# Patient Record
Sex: Male | Born: 1965 | Race: Black or African American | Hispanic: No | Marital: Single | Smoking: Current every day smoker
Health system: Southern US, Community
[De-identification: ages and names within clinical notes are randomized; demographics above are authoritative.]

## PROBLEM LIST (undated history)

## (undated) DIAGNOSIS — I10 Essential (primary) hypertension: Secondary | ICD-10-CM

## (undated) HISTORY — PX: OTHER SURGICAL HISTORY: SHX169

## (undated) HISTORY — PX: ABDOMINAL SURGERY: SHX537

---

## 2012-10-29 ENCOUNTER — Emergency Department (HOSPITAL_COMMUNITY)
Admission: EM | Admit: 2012-10-29 | Discharge: 2012-10-30 | Disposition: A | Discharge status: Home / Self Care | Payer: Medicaid Other | Attending: Emergency Medicine | Admitting: Emergency Medicine

## 2012-10-29 ENCOUNTER — Encounter (HOSPITAL_COMMUNITY): Payer: Self-pay

## 2012-10-29 DIAGNOSIS — R509 Fever, unspecified: Secondary | ICD-10-CM | POA: Insufficient documentation

## 2012-10-29 DIAGNOSIS — I10 Essential (primary) hypertension: Secondary | ICD-10-CM | POA: Insufficient documentation

## 2012-10-29 DIAGNOSIS — K5900 Constipation, unspecified: Secondary | ICD-10-CM | POA: Insufficient documentation

## 2012-10-29 DIAGNOSIS — F17200 Nicotine dependence, unspecified, uncomplicated: Secondary | ICD-10-CM | POA: Insufficient documentation

## 2012-10-29 DIAGNOSIS — R609 Edema, unspecified: Secondary | ICD-10-CM | POA: Insufficient documentation

## 2012-10-29 DIAGNOSIS — Z79899 Other long term (current) drug therapy: Secondary | ICD-10-CM | POA: Insufficient documentation

## 2012-10-29 HISTORY — DX: Essential (primary) hypertension: I10

## 2012-10-29 LAB — CBC WITH DIFFERENTIAL/PLATELET
Eosinophils Absolute: 0.2 10*3/uL (ref 0.0–0.7)
Hemoglobin: 15.4 g/dL (ref 13.0–17.0)
Lymphocytes Relative: 30 % (ref 12–46)
Lymphs Abs: 2.5 10*3/uL (ref 0.7–4.0)
MCH: 27.1 pg (ref 26.0–34.0)
Monocytes Relative: 8 % (ref 3–12)
Neutrophils Relative %: 59 % (ref 43–77)
Platelets: 284 10*3/uL (ref 150–400)
RBC: 5.68 MIL/uL (ref 4.22–5.81)
WBC: 8.3 10*3/uL (ref 4.0–10.5)

## 2012-10-29 LAB — POCT I-STAT, CHEM 8
BUN: 6 mg/dL (ref 6–23)
Chloride: 101 mEq/L (ref 96–112)
Creatinine, Ser: 0.9 mg/dL (ref 0.50–1.35)
Potassium: 3.6 mEq/L (ref 3.5–5.1)
Sodium: 138 mEq/L (ref 135–145)

## 2012-10-29 NOTE — ED Notes (Signed)
Pt complains of right sided back oain that radiates around to his abdomen for two days, no urination problems, no vomiting

## 2012-10-30 ENCOUNTER — Emergency Department (HOSPITAL_COMMUNITY): Payer: Medicaid Other

## 2012-10-30 LAB — URINALYSIS, ROUTINE W REFLEX MICROSCOPIC
Glucose, UA: NEGATIVE mg/dL
Hgb urine dipstick: NEGATIVE
Leukocytes, UA: NEGATIVE
Specific Gravity, Urine: 1.027 (ref 1.005–1.030)
pH: 6 (ref 5.0–8.0)

## 2012-10-30 LAB — URINE MICROSCOPIC-ADD ON

## 2012-10-30 MED ORDER — POLYETHYLENE GLYCOL 3350 17 G PO PACK: 17.0000 g | PACK | Freq: Every day | ORAL | Status: DC

## 2012-10-30 MED ORDER — DIPHENHYDRAMINE HCL 50 MG/ML IJ SOLN
25.0000 mg | Freq: Once | INTRAMUSCULAR | Status: DC
  Filled 2012-10-30: qty 1

## 2012-10-30 MED ORDER — KETOROLAC TROMETHAMINE 30 MG/ML IJ SOLN
30.0000 mg | Freq: Once | INTRAMUSCULAR | Status: AC
  Administered 2012-10-30: 30 mg via INTRAVENOUS
  Filled 2012-10-30: qty 1

## 2012-10-30 MED ORDER — SODIUM CHLORIDE 0.9 % IV BOLUS (SEPSIS)
1000.0000 mL | Freq: Once | INTRAVENOUS | Status: AC
  Administered 2012-10-30: 1000 mL via INTRAVENOUS

## 2012-10-30 NOTE — ED Provider Notes (Signed)
CSN: [id number]     Arrival date & time 10/29/12  2221 History     First MD Initiated Contact with Patient 10/30/12 [phone]     Chief Complaint  Patient presents with  . Abdominal Pain   (Consider location/radiation/quality/duration/timing/severity/associated sxs/prior Treatment) The history is provided by the patient. No language interpreter was used.  DUELL HOLDREN is a 47 y/o M with PMHx of HTN presenting to the ED with right-sided flank pain that has been ongoing for the past 2 days. Patient reported that the pain is a shooting discomfort with radiation to the abdomen. Stated that motion makes the pain worse. Reported that he felt feverish. Patient denied history of kidney stones. Denied nausea, vomiting, chills, chest pain, shortness of breath, difficulty breathing, dysuria, hematuria. PCP none   Past Medical History  Diagnosis Date  . Hypertension    History reviewed. No pertinent past surgical history. History reviewed. No pertinent family history. History  Substance Use Topics  . Smoking status: Current Every Day Smoker  . Smokeless tobacco: Not on file  . Alcohol Use: No    Review of Systems  Constitutional: Positive for fever.  Eyes: Negative for visual disturbance.  Respiratory: Negative for chest tightness and shortness of breath.   Gastrointestinal: Negative for nausea, vomiting and diarrhea.  Genitourinary: Positive for flank pain. Negative for dysuria, hematuria, decreased urine volume and difficulty urinating.  Neurological: Negative for weakness.  All other systems reviewed and are negative.    Allergies  Review of patient's allergies indicates no known allergies.  Home Medications   Current Outpatient Rx  Name  Route  Sig  Dispense  Refill  . amoxicillin (AMOXIL) 500 MG capsule   Oral   Take 500 mg by mouth 2 (two) times daily. 10 day therapy course patient began 8/21         . hydrochlorothiazide (HYDRODIURIL) 25 MG tablet   Oral   Take 25 mg  by mouth daily.         Marland Kitchen ibuprofen (ADVIL,MOTRIN) 200 MG tablet   Oral   Take 400 mg by mouth every 6 (six) hours as needed for pain.         Marland Kitchen lisinopril (PRINIVIL,ZESTRIL) 20 MG tablet   Oral   Take 20 mg by mouth daily.         . polyethylene glycol (MIRALAX / GLYCOLAX) packet   Oral   Take 17 g by mouth daily.   14 each   0    BP 186/96  Pulse 94  Temp(Src) 98.1 F (36.7 C) (Oral)  Resp 20  SpO2 100% Physical Exam  Nursing note and vitals reviewed. Constitutional: He is oriented to person, place, and time. He appears well-developed and well-nourished. No distress.  HENT:  Head: Normocephalic and atraumatic.  Swelling noted to the left side of the face  Eyes: Conjunctivae and EOM are normal. Pupils are equal, round, and reactive to light. Right eye exhibits no discharge. Left eye exhibits no discharge.  Neck: Normal range of motion. Neck supple.  Cardiovascular: Normal rate and regular rhythm.   Pulses:      Radial pulses are 2+ on the right side, and 2+ on the left side.       Dorsalis pedis pulses are 2+ on the right side, and 2+ on the left side.  Pulmonary/Chest: Effort normal and breath sounds normal. No respiratory distress. He has no wheezes. He has no rales.  Abdominal: Soft. Bowel sounds are normal. He exhibits  no distension. There is tenderness. There is no rebound and no guarding.    Pain upon palpation to be in right mid abdomen Negative McBurney's point Positive right-sided CVA tenderness  Musculoskeletal: Normal range of motion.  Neurological: He is alert and oriented to person, place, and time. He exhibits normal muscle tone. Coordination normal.  Skin: Skin is warm and dry. No rash noted. He is not diaphoretic. No erythema.  Psychiatric: He has a normal mood and affect. His behavior is normal. Thought content normal.    ED Course   Procedures (including critical care time)  10/30/2012 6:39 AM Discussed lab findings and imaging results with  patient in great detail - all questions answered. Discussed with patient plan for discharge.   Labs Reviewed  URINALYSIS, ROUTINE W REFLEX MICROSCOPIC - Abnormal; Notable for the following:    Protein, ur 30 (*)    All other components within normal limits  POCT I-STAT, CHEM 8 - Abnormal; Notable for the following:    Glucose, Bld 151 (*)    Calcium, Ion 1.10 (*)    All other components within normal limits  CBC WITH DIFFERENTIAL  URINE MICROSCOPIC-ADD ON   No results found. 1. Constipation   2. HTN (hypertension)     MDM  Patient presenting to the emergency department with ongoing right-sided flank pain with radiation to the abdomen has been ongoing for the past 2 days. Denied nausea, vomiting, hematuria, dysuria. Denied history of kidney stones. Alert and oriented. Negative acute abdomen, negative peritoneal signs. Bowel sounds normoactive. Soft abdomen. Discomfort noted to the right mid abdominal region. Positive right CVA tenderness. Negative Murphy sign. Negative McBurney's point. CBC negative elevation of WBCs. Chem-8 negative findings. Urine negative for infection, hemoglobin, leukocytosis. CT scan of abdomen noted stool-filled colon with negative signs of free air or free fluid in the abdomen. The appendix is normal. Patient stable, afebrile. Abdominal pain due to constipation. Discharged patient with MiraLax. Discussed with patient fiber diet and to increase water intake. Discussed with patient to follow-up with [facility]. Discussed with patient to continue to monitor symptoms and if symptoms are to worsen or change to report back to the ED - strict return instructions given. Patient agreed to plan of care, understood, all questions answered.   Raymon Mutton, PA-C 11/01/12 1837

## 2012-11-02 NOTE — ED Provider Notes (Signed)
Medical screening examination/treatment/procedure(s) were performed by non-physician practitioner and as supervising physician I was immediately available for consultation/collaboration.  Olivia Mackie, MD 11/02/12 2053

## 2012-12-28 ENCOUNTER — Encounter (HOSPITAL_COMMUNITY): Payer: Self-pay | Admitting: Emergency Medicine

## 2012-12-28 ENCOUNTER — Emergency Department (HOSPITAL_COMMUNITY): Payer: Medicaid Other

## 2012-12-28 ENCOUNTER — Emergency Department (HOSPITAL_COMMUNITY)
Admission: EM | Admit: 2012-12-28 | Discharge: 2012-12-28 | Disposition: A | Discharge status: Home / Self Care | Payer: Medicaid Other | Attending: Emergency Medicine | Admitting: Emergency Medicine

## 2012-12-28 DIAGNOSIS — L03039 Cellulitis of unspecified toe: Secondary | ICD-10-CM | POA: Insufficient documentation

## 2012-12-28 DIAGNOSIS — L03031 Cellulitis of right toe: Secondary | ICD-10-CM

## 2012-12-28 DIAGNOSIS — B353 Tinea pedis: Secondary | ICD-10-CM | POA: Insufficient documentation

## 2012-12-28 DIAGNOSIS — F17200 Nicotine dependence, unspecified, uncomplicated: Secondary | ICD-10-CM | POA: Insufficient documentation

## 2012-12-28 DIAGNOSIS — R21 Rash and other nonspecific skin eruption: Secondary | ICD-10-CM | POA: Insufficient documentation

## 2012-12-28 DIAGNOSIS — L02619 Cutaneous abscess of unspecified foot: Secondary | ICD-10-CM | POA: Insufficient documentation

## 2012-12-28 DIAGNOSIS — I10 Essential (primary) hypertension: Secondary | ICD-10-CM | POA: Insufficient documentation

## 2012-12-28 DIAGNOSIS — Z79899 Other long term (current) drug therapy: Secondary | ICD-10-CM | POA: Insufficient documentation

## 2012-12-28 MED ORDER — HYDROCODONE-ACETAMINOPHEN 5-325 MG PO TABS
1.0000 | ORAL_TABLET | Freq: Once | ORAL | Status: AC
  Administered 2012-12-28: 1 via ORAL
  Filled 2012-12-28: qty 1

## 2012-12-28 MED ORDER — TERBINAFINE HCL 1 % EX SOLN: 1.0000 | Freq: Two times a day (BID) | CUTANEOUS | Status: DC

## 2012-12-28 MED ORDER — FLUCONAZOLE 200 MG PO TABS: 200.0000 mg | ORAL_TABLET | Freq: Every day | ORAL | Status: DC

## 2012-12-28 MED ORDER — HYDROCODONE-ACETAMINOPHEN 5-325 MG PO TABS: 1.0000 | ORAL_TABLET | Freq: Four times a day (QID) | ORAL | Status: DC | PRN

## 2012-12-28 MED ORDER — SULFAMETHOXAZOLE-TRIMETHOPRIM 800-160 MG PO TABS: 1.0000 | ORAL_TABLET | Freq: Two times a day (BID) | ORAL | Status: DC

## 2012-12-28 NOTE — Progress Notes (Signed)
[facility] provided pt with a list of primary care resources.  °
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
## 2012-12-28 NOTE — Progress Notes (Signed)
[facility] consulted by ED RN for medication assistance.  Patient reports that he has a purple card and gets his prescriptions filled at the [facility].  Patient reports he gets his medications for free.  He does not pay anything for his medications.  Patient was concerned since RN stated he can get his medicaions at [facility].  Patient thought he had to go to [facility] to get his prescriptions filled.  EDCM called [facility] to find out if patient's discharge medications were on their formulary, but [facility] closed at three pm.  As per patient, he should not have any difficulty filling the prescriptions we give hime today.  [facility] informed patient that if he had any problems filling his prescriptions to contact the hospital and ask for case manager.  Patient reports that he has enough medication to get him through until Wednesday.  He will get his blood pressure medication and the prescriptions he receives today filled on Wednesday.  EDCM called [facility] to give patient a bus pass.  No further CM needs at this time.

## 2012-12-28 NOTE — ED Notes (Signed)
Patient c/o right 5th toe pain x 1 month. Patient states he does not know how he hurt his right 5th toe. Toe is red and swollen.

## 2013-01-01 NOTE — ED Provider Notes (Signed)
Medical screening examination/treatment/procedure(s) were performed by non-physician practitioner and as supervising physician I was immediately available for consultation/collaboration.    Vida Roller, MD 01/01/13 [phone]

## 2013-01-05 ENCOUNTER — Emergency Department (HOSPITAL_COMMUNITY)
Admission: EM | Admit: 2013-01-05 | Discharge: 2013-01-05 | Disposition: A | Discharge status: Home / Self Care | Payer: Medicaid Other | Attending: Emergency Medicine | Admitting: Emergency Medicine

## 2013-01-05 ENCOUNTER — Encounter (HOSPITAL_COMMUNITY): Payer: Self-pay | Admitting: Emergency Medicine

## 2013-01-05 DIAGNOSIS — B353 Tinea pedis: Secondary | ICD-10-CM | POA: Insufficient documentation

## 2013-01-05 DIAGNOSIS — Z79899 Other long term (current) drug therapy: Secondary | ICD-10-CM | POA: Insufficient documentation

## 2013-01-05 DIAGNOSIS — I10 Essential (primary) hypertension: Secondary | ICD-10-CM | POA: Insufficient documentation

## 2013-01-05 DIAGNOSIS — F17200 Nicotine dependence, unspecified, uncomplicated: Secondary | ICD-10-CM | POA: Insufficient documentation

## 2013-01-05 MED ORDER — CLOTRIMAZOLE 1 % EX CREA: TOPICAL_CREAM | CUTANEOUS | Status: DC

## 2013-01-05 NOTE — Progress Notes (Signed)
[facility] ED AM CM received a call from [facility] ED [place], Misty Stanley at 1115 to speak with pt. CM unable to speak with pt at that time but that time (meeting). [place] reported that pt had previously called and spoke with [facility] ED charge RN and [place] spoke with pt to reiterate what the charge RN informed pt.  [place] stated pt requesting that his narcotic, hydrocodone rx that he was d/c with on 12/28/12 be called in to a [place] pharmacy.  CM able to return call to pt at 1158 after reviewing EPIC preview AVS d/c medications and EPIC notes for 12/28/12 ED visit.  Pt was d/c with rx for bactrim, terbinafine, diflucan and hydrocodone for athlete's foot, Cellulitis of toe of right foot, tinea pedis of right foot  This self pay [place]  pt was seen by ED PM CM and stated he was able to get his Prescriptions filled at [facility]. Cm allowed pt to ventilate his feelings and to provide background information. Pt states he had the original hydrocodone Rx but lost it. He reports not having money on 12/2012 to fill Prescriptions but [facility] assisted with [facility]rim [facility]" He now reports having money but on a prepaid credit card and wanted ED CM to call or fax in the hydrocodone to [facility] and to provide him with a bus pass.  Reports he is staying at the [facility] and would need "two bus passes".   He reports being "offended" when previous ED staff explained to him that a "lost" prescription could not be called in and began to ask him questions to assess/get clarity on his concern and request.   CM was allowed to repeat back to the pt what his requests were ( wanted ED CM to call or fax in the hydrocodone to [facility] pharmacy and to provide him with a bus pass) Pt agreed these were his requests.  CM explained to the pt that scheduled/class 2 narcotics medications can not be called nor faxed into  a pharmacy (confirmed with Maryann of the [facility] inpatient pharmacy) and bus passes are  provided for active patients being seen physically seen by a [facility] if they meet requirements (He is not at a [facility] facility and reports having funds on a prepaid credit card) Pt voiced understanding why he could not be provided with a bus pass. He requested a copy of "why you can't call it in or fax it" Cm referred pt to the Internet for pharmacy scheduled/class 2 narcotics or to a local pharmacist. Informed him  It is not a standard for nurses and doctors do not have copies of the [place] pharmacy laws with them but are aware and follow the guidelines as instructed by the chs pharmacists and/or local pharmacists.  Apologized to the pt for his inconvenience of losing his Rx and discussed it is not the [facility] Staff's error nor responsibility if he "lost" his previous Rx for hydrocodone.  Discussed with him that the questions and assessment completed by the ED charge RN is standard assessment questions.  Encouraged pt to seek assist at his pcp office for lower level of care services or return to ED to be assessed again if he felt the need.  Explained level of care and costs to pt He states [facility] RN and NP are his PCP providers.  CM explained to him that the [facility] RN and NP generally practice under a MD who  is a pcp and to request services if needed from the [facility] MD.  The pt states he will not go to the [facility] because he has "never seen a doctor there", they refused to fill his hydrocodone because "they said they did not have enough money" and he prefers to return to [facility] so that he "can show that I got care and to get a bill"  Pt stated he was "using his minutes on his [employer] cell phone", appreciated CM speaking with him but had "to go" and he would come to [facility] ED to be seen  ED charge RN and [place] updated

## 2013-01-05 NOTE — ED Notes (Signed)
I spoke with this patient this morning.  Pt called saying that he wanted [place] to give him another prescription of hydrocodone because he "lost" the one he got on 12/28/12.  He also wanted [place] to give him a bus pass and he wanted the hydrocodone rx to either be "on the 4 dollar list" or to be free.  It was mentioned to the patient that I saw he got 2 prescriptions (one for narcotics and one for abx).  Pt states that he got the abx filled but "lost the pain medicine prescription" (even though these prescriptions print out on the same paper).  I told patient that we cannot give him another prescription without him being seen again but it would be a different provider because both the doctor and PA that saw him on 10/20 are not here today.  I encouraged the patient to either come back and be seen again or to follow up with his primary care provider.  I also told him that if he was seen here, I could not promise that he would receive a rx for narcotics.  Pt accused this Clinical research associate of thinking he was "a drug addict".  I reminded him that I never said that and was only informing him of this because since hydrocodone is a controlled substance, there are laws preventing [place] from doing what he wanted [place] to do. Pt told me that he wanted me to "print out the laws and show them to him" because he didn't think I was telling him the truth.  At the end of the conversation pt told me that we were "denying him care".  I reminded him that I continually encouraged him to come back and be reseen if he felt that was necessary.  Pt stated he was coming.
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
## 2013-01-05 NOTE — ED Notes (Signed)
Per EMS: Pt has been calling ER all day saying that he "lost his hydrocodone prescription" and demanding that we call in another one and also give him bus passes to and from the ER.  Pt then called 911 to come to the ER.  C/o pinky toe pain x 1 month.  Was seen 12/28/12.

## 2013-01-05 NOTE — ED Notes (Signed)
I spoke with this patient this morning.  Pt called saying that he wanted us to give him another prescription of hydrocodone because he "lost" the one he got on 12/28/12.  He also wanted us to give him a bus pass and he wanted the hydrocodone rx to either be "on the 4 dollar list" or to be free.  It was mentioned to the patient that I saw he got 2 prescriptions (one for narcotics and one for abx).  Pt states that he got the abx filled but "lost the pain medicine prescription" (even though these prescriptions print out on the same paper).  I told patient that we cannot give him another prescription without him being seen again but it would be a different provider because both the doctor and PA that saw him on 10/20 are not here today.  I encouraged the patient to either come back and be seen again or to follow up with his primary care provider.  I also told him that if he was seen here, I could not promise that he would receive a rx for narcotics.  Pt accused this writer of thinking he was "a drug addict".  I reminded him that I never said that and was only informing him of this because since hydrocodone is a controlled substance, there are laws preventing us from doing what he wanted us to do. Pt told me that he wanted me to "print out the laws and show them to him" because he didn't think I was telling him the truth.  At the end of the conversation pt told me that we were "denying him care".  I reminded him that I continually encouraged him to come back and be reseen if he felt that was necessary.  Pt stated he was coming.   

## 2013-01-11 NOTE — ED Provider Notes (Signed)
Medical screening examination/treatment/procedure(s) were performed by non-physician practitioner and as supervising physician I was immediately available for consultation/collaboration.  EKG Interpretation   None         Audree Camel, MD 01/11/13 0745

## 2013-01-22 DIAGNOSIS — M79609 Pain in unspecified limb: Secondary | ICD-10-CM | POA: Insufficient documentation

## 2013-01-22 DIAGNOSIS — Z79899 Other long term (current) drug therapy: Secondary | ICD-10-CM | POA: Insufficient documentation

## 2013-01-22 DIAGNOSIS — F17200 Nicotine dependence, unspecified, uncomplicated: Secondary | ICD-10-CM | POA: Insufficient documentation

## 2013-01-22 DIAGNOSIS — B353 Tinea pedis: Secondary | ICD-10-CM | POA: Insufficient documentation

## 2013-01-22 DIAGNOSIS — I10 Essential (primary) hypertension: Secondary | ICD-10-CM | POA: Insufficient documentation

## 2013-01-23 ENCOUNTER — Emergency Department (HOSPITAL_COMMUNITY)
Admission: EM | Admit: 2013-01-23 | Discharge: 2013-01-23 | Disposition: A | Discharge status: Home / Self Care | Payer: Medicaid Other | Attending: Emergency Medicine | Admitting: Emergency Medicine

## 2013-01-23 ENCOUNTER — Encounter (HOSPITAL_COMMUNITY): Payer: Self-pay | Admitting: Emergency Medicine

## 2013-01-23 DIAGNOSIS — M79674 Pain in right toe(s): Secondary | ICD-10-CM

## 2013-01-23 DIAGNOSIS — F17200 Nicotine dependence, unspecified, uncomplicated: Secondary | ICD-10-CM | POA: Insufficient documentation

## 2013-01-23 DIAGNOSIS — Z5900 Homelessness unspecified: Secondary | ICD-10-CM | POA: Insufficient documentation

## 2013-01-23 DIAGNOSIS — B353 Tinea pedis: Secondary | ICD-10-CM

## 2013-01-23 DIAGNOSIS — I10 Essential (primary) hypertension: Secondary | ICD-10-CM | POA: Insufficient documentation

## 2013-01-23 LAB — CBC
HCT: 43.5 % (ref 39.0–52.0)
MCHC: 33.8 g/dL (ref 30.0–36.0)
Platelets: 284 10*3/uL (ref 150–400)
RDW: 16.4 % — ABNORMAL HIGH (ref 11.5–15.5)
WBC: 6.8 10*3/uL (ref 4.0–10.5)

## 2013-01-23 LAB — RAPID URINE DRUG SCREEN, HOSP PERFORMED
Barbiturates: NOT DETECTED
Benzodiazepines: NOT DETECTED
Cocaine: NOT DETECTED
Opiates: NOT DETECTED

## 2013-01-23 LAB — GLUCOSE, CAPILLARY: Glucose-Capillary: 102 mg/dL — ABNORMAL HIGH (ref 70–99)

## 2013-01-23 MED ORDER — TRAMADOL HCL 50 MG PO TABS: 50.0000 mg | ORAL_TABLET | Freq: Four times a day (QID) | ORAL | Status: DC | PRN

## 2013-01-23 MED ORDER — TRAMADOL HCL 50 MG PO TABS
50.0000 mg | ORAL_TABLET | Freq: Once | ORAL | Status: AC
  Administered 2013-01-23: 50 mg via ORAL
  Filled 2013-01-23: qty 1

## 2013-01-23 NOTE — ED Notes (Signed)
Pt had previous injury to 5th toe on right foot, Previously seen at [facility], Per X-ray at [facility] long  CLINICAL DATA: Soreness of the small toe for 1 month.  EXAM:  RIGHT FIFTH TOE  COMPARISON: None.  FINDINGS:  Soft tissue swelling in the small toe. No fracture, erosion, or  foreign body observed.  IMPRESSION:  1. Small toe soft tissue swelling, without acute underlying bony  abnormality or foreign body observed.   Pt has gone through antibiotics and the toe isn't healing, the patient is still having pain associated with injury and is seeking help with this

## 2013-01-23 NOTE — ED Provider Notes (Signed)
Medical screening examination/treatment/procedure(s) were performed by non-physician practitioner and as supervising physician I was immediately available for consultation/collaboration.     Julie Manly, MD 01/23/13 0447 
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
## 2013-01-23 NOTE — ED Notes (Signed)
Unable to obtain temp 

## 2013-01-24 ENCOUNTER — Emergency Department (HOSPITAL_COMMUNITY)
Admission: EM | Admit: 2013-01-24 | Discharge: 2013-01-24 | Disposition: A | Discharge status: Home / Self Care | Payer: Medicaid Other | Source: Home / Self Care | Attending: Emergency Medicine | Admitting: Emergency Medicine

## 2013-01-24 DIAGNOSIS — Z590 Homelessness: Secondary | ICD-10-CM

## 2013-01-24 LAB — COMPREHENSIVE METABOLIC PANEL
AST: 24 U/L (ref 0–37)
Albumin: 3.6 g/dL (ref 3.5–5.2)
Alkaline Phosphatase: 65 U/L (ref 39–117)
BUN: 18 mg/dL (ref 6–23)
Chloride: 102 mEq/L (ref 96–112)
Creatinine, Ser: 0.9 mg/dL (ref 0.50–1.35)
Potassium: 4 mEq/L (ref 3.5–5.1)
Total Bilirubin: 0.2 mg/dL — ABNORMAL LOW (ref 0.3–1.2)
Total Protein: 7.2 g/dL (ref 6.0–8.3)

## 2013-01-24 LAB — ETHANOL: Alcohol, Ethyl (B): <11 mg/dL (ref 0–11)

## 2013-01-24 NOTE — ED Notes (Addendum)
MD exited room after attempting to evaluate patient. Patient became verbally aggressive with MD. MD states that she plans to discharge patient, but patient unhappy. Advises that security may be needed to escort patient from department.
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
## 2013-02-15 ENCOUNTER — Encounter (HOSPITAL_COMMUNITY): Payer: Self-pay | Admitting: Emergency Medicine

## 2013-02-15 DIAGNOSIS — F17200 Nicotine dependence, unspecified, uncomplicated: Secondary | ICD-10-CM | POA: Insufficient documentation

## 2013-02-15 DIAGNOSIS — Z5900 Homelessness unspecified: Secondary | ICD-10-CM | POA: Insufficient documentation

## 2013-02-15 DIAGNOSIS — B353 Tinea pedis: Secondary | ICD-10-CM | POA: Insufficient documentation

## 2013-02-15 DIAGNOSIS — I10 Essential (primary) hypertension: Secondary | ICD-10-CM | POA: Insufficient documentation

## 2013-02-15 DIAGNOSIS — L03039 Cellulitis of unspecified toe: Secondary | ICD-10-CM | POA: Insufficient documentation

## 2013-02-15 DIAGNOSIS — L02619 Cutaneous abscess of unspecified foot: Secondary | ICD-10-CM | POA: Insufficient documentation

## 2013-02-15 NOTE — ED Notes (Addendum)
C/o R little toe pain, ongoing for ~ 2 months, not getting better, felt worse in last 2 weeks, has been seen here and at [facility] for the same, denies h/o DM or gout, denies other than pain, (denies: fever, drainage, swelling, dizziness or other sx), OTC meds not helping. 10/10 pain.

## 2013-02-16 ENCOUNTER — Emergency Department (HOSPITAL_COMMUNITY)
Admission: EM | Admit: 2013-02-16 | Discharge: 2013-02-16 | Disposition: A | Discharge status: Home / Self Care | Payer: Medicaid Other | Attending: Emergency Medicine | Admitting: Emergency Medicine

## 2013-02-16 DIAGNOSIS — L03031 Cellulitis of right toe: Secondary | ICD-10-CM

## 2013-02-16 DIAGNOSIS — B353 Tinea pedis: Secondary | ICD-10-CM

## 2013-02-16 MED ORDER — HYDROCODONE-ACETAMINOPHEN 5-325 MG PO TABS
1.0000 | ORAL_TABLET | Freq: Once | ORAL | Status: AC
  Administered 2013-02-16: 1 via ORAL
  Filled 2013-02-16: qty 1

## 2013-02-16 MED ORDER — KETOCONAZOLE 2 % EX CREA: 1.0000 "application " | TOPICAL_CREAM | Freq: Every day | CUTANEOUS | Status: DC

## 2013-02-16 MED ORDER — CEPHALEXIN 500 MG PO CAPS: 500.0000 mg | ORAL_CAPSULE | Freq: Four times a day (QID) | ORAL | Status: DC

## 2013-02-16 NOTE — ED Provider Notes (Signed)
Medical screening examination/treatment/procedure(s) were performed by non-physician practitioner and as supervising physician I was immediately available for consultation/collaboration.    Olivia Mackie, MD 02/16/13 [phone]
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
## 2013-02-18 ENCOUNTER — Encounter (HOSPITAL_COMMUNITY): Payer: Self-pay | Admitting: Emergency Medicine

## 2013-02-18 ENCOUNTER — Emergency Department (HOSPITAL_COMMUNITY)
Admission: EM | Admit: 2013-02-18 | Discharge: 2013-02-19 | Disposition: A | Discharge status: Home / Self Care | Payer: Medicaid Other | Attending: Emergency Medicine | Admitting: Emergency Medicine

## 2013-02-18 ENCOUNTER — Emergency Department (HOSPITAL_COMMUNITY): Payer: Medicaid Other

## 2013-02-18 DIAGNOSIS — R209 Unspecified disturbances of skin sensation: Secondary | ICD-10-CM | POA: Insufficient documentation

## 2013-02-18 DIAGNOSIS — L03039 Cellulitis of unspecified toe: Secondary | ICD-10-CM | POA: Insufficient documentation

## 2013-02-18 DIAGNOSIS — I10 Essential (primary) hypertension: Secondary | ICD-10-CM | POA: Insufficient documentation

## 2013-02-18 DIAGNOSIS — L03031 Cellulitis of right toe: Secondary | ICD-10-CM

## 2013-02-18 DIAGNOSIS — F17200 Nicotine dependence, unspecified, uncomplicated: Secondary | ICD-10-CM | POA: Insufficient documentation

## 2013-02-18 DIAGNOSIS — L02619 Cutaneous abscess of unspecified foot: Secondary | ICD-10-CM | POA: Insufficient documentation

## 2013-02-18 DIAGNOSIS — Z792 Long term (current) use of antibiotics: Secondary | ICD-10-CM | POA: Insufficient documentation

## 2013-02-18 DIAGNOSIS — Z79899 Other long term (current) drug therapy: Secondary | ICD-10-CM | POA: Insufficient documentation

## 2013-02-18 MED ORDER — OXYCODONE-ACETAMINOPHEN 5-325 MG PO TABS
1.0000 | ORAL_TABLET | Freq: Once | ORAL | Status: AC
  Administered 2013-02-18: 1 via ORAL
  Filled 2013-02-18: qty 1

## 2013-02-18 MED ORDER — HYDROCODONE-ACETAMINOPHEN 5-325 MG PO TABS: 1.0000 | ORAL_TABLET | Freq: Four times a day (QID) | ORAL | Status: DC | PRN

## 2013-02-18 NOTE — ED Notes (Signed)
Pt. arrived with EMS from a motel reports pain at right 5th toe for 1 month , seen here 2 days ago with the same complaints discharged home with antibiotic and cream .
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
## 2013-02-19 NOTE — ED Provider Notes (Signed)
  Medical screening examination/treatment/procedure(s) were performed by non-physician practitioner and as supervising physician I was immediately available for consultation/collaboration.  EKG Interpretation   None          Aldean Suddeth, MD 02/19/13 0019 

## 2013-02-23 ENCOUNTER — Emergency Department (HOSPITAL_COMMUNITY)
Admission: EM | Admit: 2013-02-23 | Discharge: 2013-02-24 | Disposition: A | Discharge status: Home / Self Care | Payer: Medicaid Other | Attending: Emergency Medicine | Admitting: Emergency Medicine

## 2013-02-23 ENCOUNTER — Encounter (HOSPITAL_COMMUNITY): Payer: Self-pay | Admitting: Emergency Medicine

## 2013-02-23 DIAGNOSIS — L539 Erythematous condition, unspecified: Secondary | ICD-10-CM | POA: Insufficient documentation

## 2013-02-23 DIAGNOSIS — L03115 Cellulitis of right lower limb: Secondary | ICD-10-CM

## 2013-02-23 DIAGNOSIS — I10 Essential (primary) hypertension: Secondary | ICD-10-CM | POA: Insufficient documentation

## 2013-02-23 DIAGNOSIS — R609 Edema, unspecified: Secondary | ICD-10-CM | POA: Insufficient documentation

## 2013-02-23 DIAGNOSIS — L02619 Cutaneous abscess of unspecified foot: Secondary | ICD-10-CM | POA: Insufficient documentation

## 2013-02-23 NOTE — ED Notes (Signed)
Pt is c/o right foot pain  Pt states it has been hurting him for the past 2 mths  Pt states has been seen here for same in the past  Pt states foot is swollen and painful  Denies injury

## 2013-02-24 ENCOUNTER — Emergency Department (HOSPITAL_COMMUNITY): Payer: Medicaid Other

## 2013-02-24 LAB — CBC WITH DIFFERENTIAL/PLATELET
Basophils Absolute: 0 10*3/uL (ref 0.0–0.1)
Basophils Relative: 1 % (ref 0–1)
Eosinophils Absolute: 0.3 10*3/uL (ref 0.0–0.7)
Eosinophils Relative: 4 % (ref 0–5)
HCT: 43.5 % (ref 39.0–52.0)
Hemoglobin: 14.8 g/dL (ref 13.0–17.0)
Lymphocytes Relative: 36 % (ref 12–46)
MCH: 27 pg (ref 26.0–34.0)
MCHC: 34 g/dL (ref 30.0–36.0)
Monocytes Absolute: 0.6 10*3/uL (ref 0.1–1.0)
Monocytes Relative: 8 % (ref 3–12)
Neutro Abs: 4 10*3/uL (ref 1.7–7.7)
RDW: 16.6 % — ABNORMAL HIGH (ref 11.5–15.5)
WBC: 7.7 10*3/uL (ref 4.0–10.5)

## 2013-02-24 LAB — BASIC METABOLIC PANEL
BUN: 12 mg/dL (ref 6–23)
Calcium: 9.2 mg/dL (ref 8.4–10.5)
Chloride: 97 mEq/L (ref 96–112)
Creatinine, Ser: 0.73 mg/dL (ref 0.50–1.35)
GFR calc Af Amer: >90 mL/min (ref >90)
GFR calc non Af Amer: >90 mL/min (ref >90)

## 2013-02-24 LAB — SEDIMENTATION RATE: Sed Rate: 12 mm/hr (ref 0–16)

## 2013-02-24 MED ORDER — HYDROCODONE-ACETAMINOPHEN 5-325 MG PO TABS: 1.0000 | ORAL_TABLET | ORAL | Status: DC | PRN

## 2013-02-24 MED ORDER — SODIUM CHLORIDE 0.9 % IV SOLN
Freq: Once | INTRAVENOUS | Status: AC
  Administered 2013-02-24: 01:00:00 via INTRAVENOUS

## 2013-02-24 MED ORDER — MORPHINE SULFATE 4 MG/ML IJ SOLN
4.0000 mg | Freq: Once | INTRAMUSCULAR | Status: AC
  Administered 2013-02-24: 4 mg via INTRAVENOUS
  Filled 2013-02-24: qty 1

## 2013-02-24 MED ORDER — CLINDAMYCIN HCL 300 MG PO CAPS: 300.0000 mg | ORAL_CAPSULE | Freq: Three times a day (TID) | ORAL | Status: DC

## 2013-02-24 MED ORDER — CLINDAMYCIN PHOSPHATE 600 MG/50ML IV SOLN
600.0000 mg | Freq: Once | INTRAVENOUS | Status: AC
  Administered 2013-02-24: 600 mg via INTRAVENOUS
  Filled 2013-02-24 (×2): qty 50

## 2013-02-24 NOTE — ED Provider Notes (Signed)
Medical screening examination/treatment/procedure(s) were performed by non-physician practitioner and as supervising physician I was immediately available for consultation/collaboration.    Aldin Drees M Roseanne Juenger, MD 02/24/13 0312 
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
## 2013-02-27 ENCOUNTER — Inpatient Hospital Stay (HOSPITAL_COMMUNITY)
Admission: EM | Admit: 2013-02-27 | Discharge: 2013-03-10 | DRG: 253 | Disposition: A | Discharge status: Skilled Nursing Facility | Payer: Medicaid Other | Attending: Internal Medicine | Admitting: Internal Medicine

## 2013-02-27 ENCOUNTER — Encounter (HOSPITAL_COMMUNITY): Payer: Self-pay | Admitting: Emergency Medicine

## 2013-02-27 DIAGNOSIS — Z7982 Long term (current) use of aspirin: Secondary | ICD-10-CM

## 2013-02-27 DIAGNOSIS — I70269 Atherosclerosis of native arteries of extremities with gangrene, unspecified extremity: Principal | ICD-10-CM | POA: Diagnosis present

## 2013-02-27 DIAGNOSIS — Z5900 Homelessness unspecified: Secondary | ICD-10-CM

## 2013-02-27 DIAGNOSIS — Z5987 Material hardship due to limited financial resources, not elsewhere classified: Secondary | ICD-10-CM

## 2013-02-27 DIAGNOSIS — Z598 Other problems related to housing and economic circumstances: Secondary | ICD-10-CM

## 2013-02-27 DIAGNOSIS — Z79899 Other long term (current) drug therapy: Secondary | ICD-10-CM

## 2013-02-27 DIAGNOSIS — B353 Tinea pedis: Secondary | ICD-10-CM | POA: Diagnosis present

## 2013-02-27 DIAGNOSIS — Z91199 Patient's noncompliance with other medical treatment and regimen due to unspecified reason: Secondary | ICD-10-CM

## 2013-02-27 DIAGNOSIS — F1210 Cannabis abuse, uncomplicated: Secondary | ICD-10-CM | POA: Diagnosis present

## 2013-02-27 DIAGNOSIS — L02419 Cutaneous abscess of limb, unspecified: Secondary | ICD-10-CM | POA: Diagnosis not present

## 2013-02-27 DIAGNOSIS — L03115 Cellulitis of right lower limb: Secondary | ICD-10-CM

## 2013-02-27 DIAGNOSIS — Z9119 Patient's noncompliance with other medical treatment and regimen: Secondary | ICD-10-CM

## 2013-02-27 DIAGNOSIS — Z720 Tobacco use: Secondary | ICD-10-CM | POA: Diagnosis present

## 2013-02-27 DIAGNOSIS — F17200 Nicotine dependence, unspecified, uncomplicated: Secondary | ICD-10-CM | POA: Diagnosis present

## 2013-02-27 DIAGNOSIS — I10 Essential (primary) hypertension: Secondary | ICD-10-CM | POA: Diagnosis present

## 2013-02-27 DIAGNOSIS — L0390 Cellulitis, unspecified: Secondary | ICD-10-CM | POA: Diagnosis present

## 2013-02-27 DIAGNOSIS — I739 Peripheral vascular disease, unspecified: Secondary | ICD-10-CM | POA: Diagnosis present

## 2013-02-27 DIAGNOSIS — L02619 Cutaneous abscess of unspecified foot: Secondary | ICD-10-CM | POA: Diagnosis present

## 2013-02-27 DIAGNOSIS — Z6834 Body mass index (BMI) 34.0-34.9, adult: Secondary | ICD-10-CM

## 2013-02-27 LAB — CBC WITH DIFFERENTIAL/PLATELET
Basophils Absolute: 0 10*3/uL (ref 0.0–0.1)
Basophils Relative: 0 % (ref 0–1)
Eosinophils Absolute: 0.3 10*3/uL (ref 0.0–0.7)
Hemoglobin: 15.6 g/dL (ref 13.0–17.0)
Lymphs Abs: 2.9 10*3/uL (ref 0.7–4.0)
MCH: 26.5 pg (ref 26.0–34.0)
MCHC: 33.2 g/dL (ref 30.0–36.0)
Monocytes Relative: 8 % (ref 3–12)
Neutro Abs: 3.5 10*3/uL (ref 1.7–7.7)
Neutrophils Relative %: 48 % (ref 43–77)
Platelets: 313 10*3/uL (ref 150–400)
RDW: 16.6 % — ABNORMAL HIGH (ref 11.5–15.5)

## 2013-02-27 LAB — BASIC METABOLIC PANEL
BUN: 15 mg/dL (ref 6–23)
Chloride: 100 mEq/L (ref 96–112)
Creatinine, Ser: 0.94 mg/dL (ref 0.50–1.35)
GFR calc Af Amer: >90 mL/min (ref >90)
GFR calc non Af Amer: >90 mL/min (ref >90)
Glucose, Bld: 92 mg/dL (ref 70–99)
Potassium: 3.7 mEq/L (ref 3.5–5.1)

## 2013-02-27 MED ORDER — MORPHINE SULFATE 4 MG/ML IJ SOLN
4.0000 mg | Freq: Once | INTRAMUSCULAR | Status: AC
  Administered 2013-02-27: 4 mg via INTRAVENOUS
  Filled 2013-02-27: qty 1

## 2013-02-27 MED ORDER — MORPHINE SULFATE 4 MG/ML IJ SOLN
4.0000 mg | Freq: Once | INTRAMUSCULAR | Status: AC
  Administered 2013-02-27 – 2013-02-28 (×2): 4 mg via INTRAVENOUS
  Filled 2013-02-27: qty 1

## 2013-02-27 MED ORDER — CLINDAMYCIN PHOSPHATE 600 MG/50ML IV SOLN
600.0000 mg | Freq: Once | INTRAVENOUS | Status: AC
  Administered 2013-02-27: 600 mg via INTRAVENOUS
  Filled 2013-02-27: qty 50

## 2013-02-27 NOTE — ED Notes (Signed)
Pt presents with c/o right foot pain that he has had for two months. Pt was seen approx 4 days ago and was given pain medicine and an antibiotic. Per EMS, pt's foot is swollen, tender to palpation, but pedal pulse is present. Pt still reporting pain to that area. Ambulatory with cane.
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
## 2013-02-27 NOTE — ED Notes (Signed)
Pt says that he did not have any money to get the antibiotic filled that he was prescribed when he was last here. Pt says he used his last bit of money for the pain medication. Pt says he has been taking an antibiotic from another time.

## 2013-02-28 ENCOUNTER — Encounter (HOSPITAL_COMMUNITY): Payer: Self-pay | Admitting: Internal Medicine

## 2013-02-28 DIAGNOSIS — I10 Essential (primary) hypertension: Secondary | ICD-10-CM

## 2013-02-28 DIAGNOSIS — L0390 Cellulitis, unspecified: Secondary | ICD-10-CM | POA: Diagnosis present

## 2013-02-28 DIAGNOSIS — L02619 Cutaneous abscess of unspecified foot: Secondary | ICD-10-CM

## 2013-02-28 LAB — COMPREHENSIVE METABOLIC PANEL
ALT: 32 U/L (ref 0–53)
Alkaline Phosphatase: 71 U/L (ref 39–117)
BUN: 15 mg/dL (ref 6–23)
CO2: 26 mEq/L (ref 19–32)
Chloride: 99 mEq/L (ref 96–112)
Creatinine, Ser: 0.87 mg/dL (ref 0.50–1.35)
GFR calc Af Amer: >90 mL/min (ref >90)
GFR calc non Af Amer: >90 mL/min (ref >90)
Glucose, Bld: 153 mg/dL — ABNORMAL HIGH (ref 70–99)
Potassium: 3.5 mEq/L (ref 3.5–5.1)
Sodium: 135 mEq/L (ref 135–145)
Total Bilirubin: 0.6 mg/dL (ref 0.3–1.2)
Total Protein: 7.5 g/dL (ref 6.0–8.3)

## 2013-02-28 LAB — MAGNESIUM: Magnesium: 1.9 mg/dL (ref 1.5–2.5)

## 2013-02-28 LAB — CBC
MCH: 26.9 pg (ref 26.0–34.0)
Platelets: 260 10*3/uL (ref 150–400)
RBC: 5.32 MIL/uL (ref 4.22–5.81)
RDW: 16.6 % — ABNORMAL HIGH (ref 11.5–15.5)
WBC: 7.6 10*3/uL (ref 4.0–10.5)

## 2013-02-28 LAB — PHOSPHORUS: Phosphorus: 3.4 mg/dL (ref 2.3–4.6)

## 2013-02-28 LAB — HIV ANTIBODY (ROUTINE TESTING W REFLEX): HIV: NONREACTIVE

## 2013-02-28 MED ORDER — HYDROCODONE-ACETAMINOPHEN 5-325 MG PO TABS
1.0000 | ORAL_TABLET | ORAL | Status: DC | PRN
  Administered 2013-02-28 – 2013-03-03 (×18): 2 via ORAL
  Filled 2013-02-28 (×18): qty 2

## 2013-02-28 MED ORDER — HYDROCODONE-ACETAMINOPHEN 5-325 MG PO TABS: 1.0000 | ORAL_TABLET | ORAL | Status: DC | PRN

## 2013-02-28 MED ORDER — VANCOMYCIN HCL 10 G IV SOLR
2000.0000 mg | Freq: Once | INTRAVENOUS | Status: AC
  Administered 2013-02-28: 04:00:00 2000 mg via INTRAVENOUS
  Filled 2013-02-28: qty 2000

## 2013-02-28 MED ORDER — ACETAMINOPHEN 325 MG PO TABS: 650.0000 mg | ORAL_TABLET | Freq: Four times a day (QID) | ORAL | Status: DC | PRN

## 2013-02-28 MED ORDER — SODIUM CHLORIDE 0.9 % IV SOLN: 250.0000 mL | INTRAVENOUS | Status: DC | PRN

## 2013-02-28 MED ORDER — ONDANSETRON HCL 4 MG/2ML IJ SOLN: 4.0000 mg | Freq: Four times a day (QID) | INTRAMUSCULAR | Status: DC | PRN

## 2013-02-28 MED ORDER — KETOCONAZOLE 2 % EX CREA
TOPICAL_CREAM | Freq: Two times a day (BID) | CUTANEOUS | Status: DC
  Administered 2013-02-28 – 2013-03-03 (×6): via TOPICAL
  Filled 2013-02-28 (×2): qty 15

## 2013-02-28 MED ORDER — ENOXAPARIN SODIUM 60 MG/0.6ML ~~LOC~~ SOLN
55.0000 mg | SUBCUTANEOUS | Status: DC
  Administered 2013-02-28 – 2013-03-04 (×4): 55 mg via SUBCUTANEOUS
  Filled 2013-02-28 (×6): qty 0.6

## 2013-02-28 MED ORDER — SODIUM CHLORIDE 0.9 % IJ SOLN: 3.0000 mL | Freq: Two times a day (BID) | INTRAMUSCULAR | Status: DC

## 2013-02-28 MED ORDER — ACETAMINOPHEN 650 MG RE SUPP: 650.0000 mg | Freq: Four times a day (QID) | RECTAL | Status: DC | PRN

## 2013-02-28 MED ORDER — SODIUM CHLORIDE 0.9 % IJ SOLN: 3.0000 mL | INTRAMUSCULAR | Status: DC | PRN

## 2013-02-28 MED ORDER — ONDANSETRON HCL 4 MG PO TABS: 4.0000 mg | ORAL_TABLET | Freq: Four times a day (QID) | ORAL | Status: DC | PRN

## 2013-02-28 MED ORDER — LISINOPRIL 20 MG PO TABS
20.0000 mg | ORAL_TABLET | Freq: Every day | ORAL | Status: DC
  Administered 2013-02-28 – 2013-03-02 (×3): 20 mg via ORAL
  Filled 2013-02-28 (×3): qty 1

## 2013-02-28 MED ORDER — LEVOFLOXACIN IN D5W 500 MG/100ML IV SOLN
500.0000 mg | INTRAVENOUS | Status: DC
  Administered 2013-02-28 – 2013-03-02 (×3): 500 mg via INTRAVENOUS
  Filled 2013-02-28 (×4): qty 100

## 2013-02-28 MED ORDER — MORPHINE SULFATE 2 MG/ML IJ SOLN
2.0000 mg | INTRAMUSCULAR | Status: DC | PRN
  Administered 2013-02-28: 10:00:00 2 mg via INTRAVENOUS
  Administered 2013-02-28: 07:00:00 4 mg via INTRAVENOUS
  Filled 2013-02-28 (×3): qty 2

## 2013-02-28 MED ORDER — MORPHINE SULFATE 2 MG/ML IJ SOLN
2.0000 mg | INTRAMUSCULAR | Status: DC | PRN
  Administered 2013-02-28: 02:00:00 4 mg via INTRAVENOUS
  Filled 2013-02-28: qty 2

## 2013-02-28 MED ORDER — MORPHINE SULFATE 4 MG/ML IJ SOLN
INTRAMUSCULAR | Status: AC
  Administered 2013-02-28: 05:00:00 4 mg
  Filled 2013-02-28: qty 1

## 2013-02-28 MED ORDER — LISINOPRIL-HYDROCHLOROTHIAZIDE 20-25 MG PO TABS: 1.0000 | ORAL_TABLET | Freq: Every day | ORAL | Status: DC

## 2013-02-28 MED ORDER — SODIUM CHLORIDE 0.9 % IV SOLN
INTRAVENOUS | Status: AC
  Administered 2013-03-01: 02:00:00 via INTRAVENOUS

## 2013-02-28 MED ORDER — DOCUSATE SODIUM 100 MG PO CAPS
100.0000 mg | ORAL_CAPSULE | Freq: Two times a day (BID) | ORAL | Status: DC
  Administered 2013-02-28 – 2013-03-04 (×10): 100 mg via ORAL
  Filled 2013-02-28 (×7): qty 1

## 2013-02-28 MED ORDER — VANCOMYCIN HCL IN DEXTROSE 1-5 GM/200ML-% IV SOLN
1000.0000 mg | Freq: Two times a day (BID) | INTRAVENOUS | Status: DC
  Administered 2013-02-28 – 2013-03-02 (×5): 1000 mg via INTRAVENOUS
  Filled 2013-02-28 (×5): qty 200

## 2013-02-28 MED ORDER — HYDROCHLOROTHIAZIDE 25 MG PO TABS
25.0000 mg | ORAL_TABLET | Freq: Every day | ORAL | Status: DC
  Administered 2013-02-28 – 2013-03-02 (×3): 25 mg via ORAL
  Filled 2013-02-28 (×3): qty 1

## 2013-02-28 MED ORDER — HYDROMORPHONE HCL PF 1 MG/ML IJ SOLN
1.0000 mg | INTRAMUSCULAR | Status: DC | PRN
  Administered 2013-02-28 (×3): 1 mg via INTRAVENOUS
  Administered 2013-03-01 (×7): 2 mg via INTRAVENOUS
  Administered 2013-03-01: 1 mg via INTRAVENOUS
  Administered 2013-03-02 (×5): 2 mg via INTRAVENOUS
  Administered 2013-03-02: 1 mg via INTRAVENOUS
  Administered 2013-03-02 – 2013-03-03 (×4): 2 mg via INTRAVENOUS
  Administered 2013-03-03: 1 mg via INTRAVENOUS
  Administered 2013-03-03 – 2013-03-04 (×10): 2 mg via INTRAVENOUS
  Administered 2013-03-05: 1 mg via INTRAVENOUS
  Filled 2013-02-28 (×2): qty 2
  Filled 2013-02-28: qty 1
  Filled 2013-02-28 (×4): qty 2
  Filled 2013-02-28: qty 1
  Filled 2013-02-28 (×2): qty 2
  Filled 2013-02-28: qty 1
  Filled 2013-02-28 (×2): qty 2
  Filled 2013-02-28: qty 1
  Filled 2013-02-28 (×11): qty 2
  Filled 2013-02-28: qty 1
  Filled 2013-02-28 (×7): qty 2

## 2013-02-28 NOTE — Progress Notes (Signed)
[facility] PROGRESS NOTE  Zimir Kittleson Sundquist ZOX:[id number] DOB: 05/15/1965 DOA: 02/27/2013  PCP: Does not have a PCP  Brief HPI: Wayne Davis is a 47 y.o. male with a past medical history of Hypertension. He presented with a 2 month history of foot pain starting with infection of right 5th toe that progressed due to medication noncompliance due to financial issues. He was admitted for management of foot cellulitis.  Past medical history:  Past Medical History  Diagnosis Date  . Hypertension     Consultants: None  Procedures: None  Antibiotics: Vanc 12/21--> Levaquin 12/21-->  Subjective: Patient complains of 10/10 pain in right foot.   Objective: Vital Signs  Filed Vitals:   02/28/13 0116 02/28/13 0135 02/28/13 0153 02/28/13 0556  BP: 176/76 136/81  148/68  Pulse: 100 92  76  Temp: 98.2 F (36.8 C) 98.4 F (36.9 C)  98.5 F (36.9 C)  TempSrc: Oral Oral  Oral  Resp: 20 20  16   Height:   6\' 1"  (1.854 m)   Weight:   113.39 kg (249 lb 15.7 oz)   SpO2: 98% 100%  98%    Intake/Output Summary (Last 24 hours) at 02/28/13 0915 Last data filed at 02/28/13 8119  Gross per 24 hour  Intake 1751.25 ml  Output      0 ml  Net 1751.25 ml   Filed Weights   02/28/13 0153  Weight: 113.39 kg (249 lb 15.7 oz)    General appearance: alert, cooperative, appears stated age, no distress and moderately obese Head: Normocephalic, without obvious abnormality, atraumatic Resp: clear to auscultation bilaterally Cardio: regular rate and rhythm, S1, S2 normal, no murmur, click, rub or gallop GI: soft, non-tender; bowel sounds normal; no masses,  no organomegaly Extremities: Swollen right foot with tenderness to palpation. No fluctutation. Pulses poor but present. Poor hygiene noted. Tinea in between toes, worse around 5th digit. Neurologic:No focal deficits  Lab Results:  Basic Metabolic Panel:  Recent Labs Lab 02/24/13 0045 02/27/13 2225 02/28/13 0644   NA 135 139 135  K 3.5 3.7 3.5  CL 97 100 99  CO2 27 28 26   GLUCOSE 136* 92 153*  BUN 12 15 15   CREATININE 0.73 0.94 0.87  CALCIUM 9.2 9.8 8.8  MG  --   --  1.9  PHOS  --   --  3.4   Liver Function Tests:  Recent Labs Lab 02/28/13 0644  AST 25  ALT 32  ALKPHOS 71  BILITOT 0.6  PROT 7.5  ALBUMIN 3.5   CBC:  Recent Labs Lab 02/24/13 0045 02/27/13 2225 02/28/13 0644  WBC 7.7 7.4 7.6  NEUTROABS 4.0 3.5  --   HGB 14.8 15.6 14.3  HCT 43.5 47.0 42.5  MCV 79.4 79.9 79.9  PLT 283 313 260    Studies/Results: No results found.  Medications:  Scheduled: . sodium chloride   Intravenous STAT  . docusate sodium  100 mg Oral BID  . enoxaparin (LOVENOX) injection  55 mg Subcutaneous Q24H  . lisinopril  20 mg Oral Daily   And  . hydrochlorothiazide  25 mg Oral Daily  . levofloxacin (LEVAQUIN) IV  500 mg Intravenous Q24H  . sodium chloride  3 mL Intravenous Q12H  . vancomycin  1,000 mg Intravenous Q12H   Continuous:  JYN:WGNFAO chloride, acetaminophen, acetaminophen, HYDROcodone-acetaminophen, morphine injection, ondansetron (ZOFRAN) IV, ondansetron, sodium chloride  Assessment/Plan:  Active Problems:   Cellulitis and abscess of foot   HTN (hypertension)   Cellulitis  Cellulitis involving Right Foot with Tinea Continue Vanc. Add Levaquin. Antifungal cream to areas between toes. No osteomyelitis seen on xray. Pain control. HIV is pending. Keep legs elevated.  History of HTN (hypertension) BP is stable. Continue home medications   Homelessness Social work consult.   Erectile Dysfucntion Patient asked to pursue this as OP. He could have peripheral vascular disease. Will also need to have his testosterone levels checked but this has to be done as OP.  Code Status: Full Code  DVT Prophylaxis: Lovenox    Family Communication: Discussed with patient.  Disposition Plan: Not ready for discharge. CSW to address homelessness.    LOS: 1 day    [facility]  [facility] Pager [phone] 02/28/2013, 9:15 AM  If 8PM-8AM, please contact night-coverage at [email or web address], password [facility]
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
## 2013-02-28 NOTE — Progress Notes (Signed)
ANTIBIOTIC CONSULT NOTE - INITIAL  Pharmacy Consult for Vancomycin Indication: Cellulitis  No Known Allergies  Patient Measurements: Height: 6\' 1"  (185.4 cm) Weight: 249 lb 15.7 oz (113.39 kg) IBW/kg (Calculated) : 79.9  Vital Signs: Temp: 98.4 F (36.9 C) (12/21 0135) Temp src: Oral (12/21 0135) BP: 136/81 mmHg (12/21 0135) Pulse Rate: 92 (12/21 0135) Intake/Output from previous day: 12/20 0701 - 12/21 0700 In: 1370 [P.O.:360; I.V.:1010] Out: -  Intake/Output from this shift: Total I/O In: 1370 [P.O.:360; I.V.:1010] Out: -   Labs:  Recent Labs  02/27/13 2225  WBC 7.4  HGB 15.6  PLT 313  CREATININE 0.94   Estimated Creatinine Clearance: 125.4 ml/min (by C-G formula based on Cr of 0.94). No results found for this basename: VANCOTROUGH, VANCOPEAK, VANCORANDOM, GENTTROUGH, GENTPEAK, GENTRANDOM, TOBRATROUGH, TOBRAPEAK, TOBRARND, AMIKACINPEAK, AMIKACINTROU, AMIKACIN,  in the last 72 hours   Microbiology: No results found for this or any previous visit (from the past 720 hour(s)).  Medical History: Past Medical History  Diagnosis Date  . Hypertension     Medications:  Scheduled:  . sodium chloride   Intravenous STAT  . docusate sodium  100 mg Oral BID  . enoxaparin (LOVENOX) injection  55 mg Subcutaneous Q24H  . lisinopril  20 mg Oral Daily   And  . hydrochlorothiazide  25 mg Oral Daily  . sodium chloride  3 mL Intravenous Q12H   Infusions:   Assessment:  47 yr male with complaint of foot pain x 2 months. Has been on clindamycin in the past  Received Clindamycin 600mg  IV x 1 in ED @ 22:45 on 12/20  IV Vancomycin per pharmacy to begin for cellulitis of foot  CrCl (n) - 97 ml/min  Goal of Therapy:  Vancomycin trough level 10-15 mcg/ml  Plan:  Measure antibiotic drug levels at steady state Follow up culture results Vancomycin 2000mg  IV x 1 load followed by 1000mg  IV q12h  [place] Fassnacht, Joselyn Glassman, PharmD 02/28/2013,2:44 AM
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
## 2013-02-28 NOTE — Progress Notes (Signed)
   CARE MANAGEMENT NOTE 02/28/2013  Patient:  MOUHAMADOU, GITTLEMAN   Account Number:  [id number]  Date Initiated:  02/28/2013  Documentation initiated by:  [facility]  Subjective/Objective Assessment:   cellulitis of right foot     Action/Plan:   homeless   Anticipated DC Date:  02/28/2013   Anticipated DC Plan:  HOME/SELF CARE  In-house referral  Clinical Social Worker      DC Planning Services  CM consult  PCP issues      Choice offered to / List presented to:             Status of service:  In process, will continue to follow Medicare Important Message given?   (If response is "NO", the following Medicare IM given date fields will be blank) Date Medicare IM given:   Date Additional Medicare IM given:    Discharge Disposition:    Per UR Regulation:    If discussed at Long Length of Stay Meetings, dates discussed:    Comments:  02/28/2013 1830 NCM spoke to pt and states he is currently with a permanent residence. He does utilize shelter and churches for lodging during the colder months. States he get a small disability check and able to get his meds [facility] at low copay price with his Medicaid card. He uses the [facility] for assistance also such as washing clothes, and help with meds. Pt states he has not established a PCP or is not sure if he was assigned PCP with his Medicaid. NCM will continue to follow up with for dc needs and arranging appt with PCP. Isidoro Donning RN CCM Case Mgmt phone [phone]

## 2013-03-01 DIAGNOSIS — M79609 Pain in unspecified limb: Secondary | ICD-10-CM

## 2013-03-01 LAB — CBC
Hemoglobin: 14.2 g/dL (ref 13.0–17.0)
MCH: 26.5 pg (ref 26.0–34.0)
MCHC: 32.6 g/dL (ref 30.0–36.0)
Platelets: 270 10*3/uL (ref 150–400)
RBC: 5.36 MIL/uL (ref 4.22–5.81)

## 2013-03-01 LAB — BASIC METABOLIC PANEL
CO2: 28 mEq/L (ref 19–32)
Calcium: 9.1 mg/dL (ref 8.4–10.5)
GFR calc Af Amer: >90 mL/min (ref >90)
GFR calc non Af Amer: >90 mL/min (ref >90)
Glucose, Bld: 133 mg/dL — ABNORMAL HIGH (ref 70–99)
Potassium: 3.7 mEq/L (ref 3.5–5.1)
Sodium: 134 mEq/L — ABNORMAL LOW (ref 135–145)

## 2013-03-01 NOTE — ED Provider Notes (Signed)
Medical screening examination/treatment/procedure(s) were performed by non-physician practitioner and as supervising physician I was immediately available for consultation/collaboration.  Maysen Bonsignore L Atthew Coutant, MD 03/01/13 2125 

## 2013-03-01 NOTE — Progress Notes (Signed)
Utilization review completed.  

## 2013-03-01 NOTE — Progress Notes (Signed)
[facility] PROGRESS NOTE  Wayne Davis XBJ:[id number] DOB: 1965-03-21 DOA: 02/27/2013  PCP: Does not have a PCP  Brief HPI: [name] is a 47 y.o. male with a past medical history of Hypertension. He presented with a 2 month history of foot pain starting with infection of right 5th toe that progressed due to medication noncompliance due to financial issues. He was admitted for management of foot cellulitis.  Past medical history:  Past Medical History  Diagnosis Date  . Hypertension     Consultants: None  Procedures: None  Antibiotics: Vanc 12/21--> Levaquin 12/21-->  Subjective: Patient continues to have 10/10 pain in right foot. No other complaints.  Objective: Vital Signs  Filed Vitals:   02/28/13 0556 02/28/13 1400 02/28/13 2014 03/01/13 0520  BP: 148/68 128/76 139/72 158/90  Pulse: 76 85 87 83  Temp: 98.5 F (36.9 C) 98.6 F (37 C) 98.4 F (36.9 C) 98.5 F (36.9 C)  TempSrc: Oral Oral Oral Oral  Resp: 16 20 16 12   Height:      Weight:      SpO2: 98% 100% 95% 95%    Intake/Output Summary (Last 24 hours) at 03/01/13 1254 Last data filed at 03/01/13 1021  Gross per 24 hour  Intake 2839.17 ml  Output    590 ml  Net 2249.17 ml   Filed Weights   02/28/13 0153  Weight: 113.39 kg (249 lb 15.7 oz)    General appearance: alert, cooperative, appears stated age, no distress and moderately obese Resp: clear to auscultation bilaterally Cardio: regular rate and rhythm, S1, S2 normal, no murmur, click, rub or gallop GI: soft, non-tender; bowel sounds normal; no masses,  no organomegaly Extremities: Swelling seems to be better. Right 5th toe has open wound medially. Very tender. No fluctutation. Pulses poor but present. Poor hygiene noted. Tinea in between toes, worse around 5th digit. Neurologic:No focal deficits  Lab Results:  Basic Metabolic Panel:  Recent Labs Lab 02/24/13 0045 02/27/13 2225 02/28/13 0644 03/01/13 0441   NA 135 139 135 134*  K 3.5 3.7 3.5 3.7  CL 97 100 99 94*  CO2 27 28 26 28   GLUCOSE 136* 92 153* 133*  BUN 12 15 15 8   CREATININE 0.73 0.94 0.87 0.92  CALCIUM 9.2 9.8 8.8 9.1  MG  --   --  1.9  --   PHOS  --   --  3.4  --    Liver Function Tests:  Recent Labs Lab 02/28/13 0644  AST 25  ALT 32  ALKPHOS 71  BILITOT 0.6  PROT 7.5  ALBUMIN 3.5   CBC:  Recent Labs Lab 02/24/13 0045 02/27/13 2225 02/28/13 0644 03/01/13 0441  WBC 7.7 7.4 7.6 6.1  NEUTROABS 4.0 3.5  --   --   HGB 14.8 15.6 14.3 14.2  HCT 43.5 47.0 42.5 43.6  MCV 79.4 79.9 79.9 81.3  PLT 283 313 260 270    Studies/Results: No results found.  Medications:  Scheduled: . sodium chloride   Intravenous STAT  . docusate sodium  100 mg Oral BID  . enoxaparin (LOVENOX) injection  55 mg Subcutaneous Q24H  . lisinopril  20 mg Oral Daily   And  . hydrochlorothiazide  25 mg Oral Daily  . ketoconazole   Topical BID  . levofloxacin (LEVAQUIN) IV  500 mg Intravenous Q24H  . vancomycin  1,000 mg Intravenous Q12H   Continuous:  HYQ:[id number], acetaminophen, HYDROcodone-acetaminophen, HYDROmorphone (DILAUDID) injection, ondansetron (ZOFRAN) IV, ondansetron  Assessment/Plan:  Active Problems:   Cellulitis and abscess of foot   HTN (hypertension)   Cellulitis    Cellulitis involving Right Foot with Tinea Continue Vanc and Levaquin. Antifungal cream to areas between toes. No osteomyelitis seen on xray. Pain control. HIV is negative. Keep legs elevated. Will get arterial dopplers to rule out circulatory issues. May need to involve Ortho.  History of HTN (hypertension) BP is stable. Continue home medications   Homelessness Social work following.   Erectile Dysfucntion Patient asked to pursue this as OP. He could have peripheral vascular disease. Will also need to have his testosterone levels checked but this has to be done as OP.  Code Status: Full Code  DVT Prophylaxis: Lovenox    Family  Communication: Discussed with patient.  Disposition Plan: Not ready for discharge. CSW to address homelessness.    LOS: 2 days   [facility]  [facility] Pager [phone] 03/01/2013, 12:54 PM  If 8PM-8AM, please contact night-coverage at [email or web address], password [facility]

## 2013-03-01 NOTE — Progress Notes (Signed)
VASCULAR LAB PRELIMINARY  ARTERIAL  ABI completed:    RIGHT    LEFT    PRESSURE WAVEFORM  PRESSURE WAVEFORM  BRACHIAL  triphasic BRACHIAL 123 triphasic  DP   DP    AT 33 Severely dampened monophasic AT 41  Severely dampened monophasic  PT 37 Severely dampened monophasic PT  inaudible  PER   PER    GREAT TOE  flat GREAT TOE  flat    RIGHT LEFT  ABI 0.3 0.33     Coni Homesley, RVT 03/01/2013, 3:02 PM

## 2013-03-01 NOTE — Progress Notes (Signed)
Clinical Social Work Department BRIEF PSYCHOSOCIAL ASSESSMENT 03/01/2013  Patient:  Wayne Davis, Wayne Davis     Account Number:  [id number]     Admit date:  02/27/2013  Clinical Social Worker:  Candie Chroman  Date/Time:  03/01/2013 08:53 AM  Referred by:  Physician  Date Referred:  02/28/2013 Referred for  Homelessness   Other Referral:   Interview type:  patient Other interview type:    PSYCHOSOCIAL DATA Living Status:  OTHER Admitted from facility:   Level of care:   Primary support name:  Mrs. Roxan Hockey Primary support relationship to patient:  not stated Degree of support available:   unclear    CURRENT CONCERNS  Other Concerns:    SOCIAL WORK ASSESSMENT / PLAN Pt is a 47 yr old gentleman staying at a shelter prior to hospitalization. CSW met with pt this am to assist with d/c planning. Pt states he has no permanent housing at this time. Pt utilizes [employer] ) during the day and stays at shelters or motels when he has funds ( SSD ) available. He is working with housing authority for permanent housing. If IV antibiotics are not needed at time of d/c pt will return to [facility] / emergency shelter . CSW will provide bus passes , if needed. CSW will continue to follow to assist with d/c planning.   Assessment/plan status:   Other assessment/ plan:   Information/referral to community resources:   Pt is familiar with homelessness resources available.    PATIENT'S/FAMILY'S RESPONSE TO PLAN OF CARE: " I'm trying to get permanent housing. I'll go back to the shelter when I'm d/c. "  Cori Razor LCSW [phone]

## 2013-03-02 DIAGNOSIS — I70269 Atherosclerosis of native arteries of extremities with gangrene, unspecified extremity: Secondary | ICD-10-CM

## 2013-03-02 DIAGNOSIS — I739 Peripheral vascular disease, unspecified: Secondary | ICD-10-CM

## 2013-03-02 LAB — CBC
Hemoglobin: 13.1 g/dL (ref 13.0–17.0)
MCH: 26 pg (ref 26.0–34.0)
MCV: 80.9 fL (ref 78.0–100.0)
Platelets: 241 10*3/uL (ref 150–400)
RBC: 5.03 MIL/uL (ref 4.22–5.81)
WBC: 5.7 10*3/uL (ref 4.0–10.5)

## 2013-03-02 LAB — VANCOMYCIN, TROUGH: Vancomycin Tr: 6.4 ug/mL — ABNORMAL LOW (ref 10.0–20.0)

## 2013-03-02 LAB — BASIC METABOLIC PANEL
CO2: 29 mEq/L (ref 19–32)
Calcium: 8.8 mg/dL (ref 8.4–10.5)
Chloride: 93 mEq/L — ABNORMAL LOW (ref 96–112)
GFR calc Af Amer: >90 mL/min (ref >90)
Glucose, Bld: 187 mg/dL — ABNORMAL HIGH (ref 70–99)
Potassium: 3.5 mEq/L (ref 3.5–5.1)
Sodium: 132 mEq/L — ABNORMAL LOW (ref 135–145)

## 2013-03-02 MED ORDER — HYDRALAZINE HCL 20 MG/ML IJ SOLN: 10.0000 mg | Freq: Four times a day (QID) | INTRAMUSCULAR | Status: DC | PRN

## 2013-03-02 MED ORDER — DEXTROSE 5 % IV SOLN
2.0000 g | Freq: Two times a day (BID) | INTRAVENOUS | Status: DC
  Administered 2013-03-02 – 2013-03-04 (×5): 2 g via INTRAVENOUS
  Filled 2013-03-02 (×10): qty 2

## 2013-03-02 MED ORDER — VANCOMYCIN HCL 10 G IV SOLR
1500.0000 mg | Freq: Two times a day (BID) | INTRAVENOUS | Status: DC
  Administered 2013-03-03 – 2013-03-05 (×5): 1500 mg via INTRAVENOUS
  Filled 2013-03-02 (×7): qty 1500

## 2013-03-02 MED ORDER — AMLODIPINE BESYLATE 5 MG PO TABS
5.0000 mg | ORAL_TABLET | Freq: Every day | ORAL | Status: DC
  Administered 2013-03-02 – 2013-03-03 (×2): 5 mg via ORAL
  Filled 2013-03-02 (×2): qty 1

## 2013-03-02 NOTE — Progress Notes (Signed)
Report called to rachel RN at [facility] Joetta Manners RN

## 2013-03-02 NOTE — Care Management Note (Signed)
    Page 1 of 1   03/02/2013     6:49:22 PM   CARE MANAGEMENT NOTE 03/02/2013  Patient:  MCLEAN, MOYA   Account Number:  [id number]  Date Initiated:  02/28/2013  Documentation initiated by:  [facility]  Subjective/Objective Assessment:   cellulitis of right foot     Action/Plan:   homeless   Anticipated DC Date:  02/28/2013   Anticipated DC Plan:  HOME/SELF CARE  In-house referral  Clinical Social Worker      DC Planning Services  CM consult  PCP issues      Choice offered to / List presented to:             Status of service:  Completed, signed off Medicare Important Message given?   (If response is "NO", the following Medicare IM given date fields will be blank) Date Medicare IM given:   Date Additional Medicare IM given:    Discharge Disposition:  ACUTE TO ACUTE TRANS  Per UR Regulation:    If discussed at Long Length of Stay Meetings, dates discussed:    Comments:  03/02/2013 Colleen Can BSN RN CCM [phone] Current plans are for patient to tranfer to [facility] for surgery.  02/28/2013 1830 NCM spoke to pt and states he is currently with a permanent residence. He does utilize shelter and churches for lodging during the colder months. States he get a small disability check and able to get his meds [facility] at low copay price with his Medicaid card. He uses the [facility] for assistance also such as washing clothes, and help with meds. Pt states he has not established a PCP or is not sure if he was assigned PCP with his Medicaid. NCM will continue to follow up with for dc needs and arranging appt with PCP. Isidoro Donning RN CCM Case Mgmt phone [phone]

## 2013-03-02 NOTE — Progress Notes (Addendum)
[facility] PROGRESS NOTE  Kenaz Olafson [place] ZOX:[id number] DOB: 06-04-1965 DOA: 02/27/2013  PCP: Does not have a PCP  Brief HPI: Wayne Davis is a 47 y.o. male with a past medical history of Hypertension. He presented with a 2 month history of foot pain starting with infection of right 5th toe that progressed due to medication noncompliance due to financial issues. He was admitted for management of foot cellulitis.  Past medical history:  Past Medical History  Diagnosis Date  . Hypertension     Consultants: Dr. Edilia Bo with Vascular  Procedures: LE Arteriogram planned for 12/24  Antibiotics: Vanc 12/21--> Levaquin 12/21-->  Subjective: Patient continues to have 10/10 pain in right foot. No other complaints.  Objective: Vital Signs  Filed Vitals:   03/01/13 1428 03/01/13 2159 03/02/13 0523 03/02/13 1354  BP: 118/62 144/84 137/90 119/85  Pulse: 73 89 79 90  Temp: 98.6 F (37 C) 98 F (36.7 C) 98.3 F (36.8 C) 97.9 F (36.6 C)  TempSrc: Oral Oral Oral   Resp: 14 16 16 16   Height:      Weight:      SpO2: 98% 96% 97% 93%    Intake/Output Summary (Last 24 hours) at 03/02/13 1526 Last data filed at 03/02/13 1300  Gross per 24 hour  Intake    860 ml  Output   1350 ml  Net   -490 ml   Filed Weights   02/28/13 0153  Weight: 113.39 kg (249 lb 15.7 oz)    General appearance: alert, cooperative, appears stated age, no distress and moderately obese Resp: clear to auscultation bilaterally Cardio: regular rate and rhythm, S1, S2 normal, no murmur, click, rub or gallop GI: soft, non-tender; bowel sounds normal; no masses,  no organomegaly Extremities: Swelling seems to be better. Right 5th toe has open wound medially. Very tender. No fluctutation. Pulses poor but present. Poor hygiene noted. Tinea in between toes, worse around 5th digit. Neurologic:No focal deficits  Lab Results:  Basic Metabolic Panel:  Recent Labs Lab 02/24/13 0045  02/27/13 2225 02/28/13 0644 03/01/13 0441 03/02/13 0452  NA 135 139 135 134* 132*  K 3.5 3.7 3.5 3.7 3.5  CL 97 100 99 94* 93*  CO2 27 28 26 28 29   GLUCOSE 136* 92 153* 133* 187*  BUN 12 15 15 8 7   CREATININE 0.73 0.94 0.87 0.92 0.90  CALCIUM 9.2 9.8 8.8 9.1 8.8  MG  --   --  1.9  --   --   PHOS  --   --  3.4  --   --    Liver Function Tests:  Recent Labs Lab 02/28/13 0644  AST 25  ALT 32  ALKPHOS 71  BILITOT 0.6  PROT 7.5  ALBUMIN 3.5   CBC:  Recent Labs Lab 02/24/13 0045 02/27/13 2225 02/28/13 0644 03/01/13 0441 03/02/13 0452  WBC 7.7 7.4 7.6 6.1 5.7  NEUTROABS 4.0 3.5  --   --   --   HGB 14.8 15.6 14.3 14.2 13.1  HCT 43.5 47.0 42.5 43.6 40.7  MCV 79.4 79.9 79.9 81.3 80.9  PLT 283 313 260 270 241    Studies/Results: No results found.  Medications:  Scheduled: . docusate sodium  100 mg Oral BID  . enoxaparin (LOVENOX) injection  55 mg Subcutaneous Q24H  . lisinopril  20 mg Oral Daily   And  . hydrochlorothiazide  25 mg Oral Daily  . ketoconazole   Topical BID  . levofloxacin (LEVAQUIN) IV  500 mg Intravenous Q24H  . vancomycin  1,000 mg Intravenous Q12H   Continuous:  UJW:[id number], acetaminophen, HYDROcodone-acetaminophen, HYDROmorphone (DILAUDID) injection, ondansetron (ZOFRAN) IV, ondansetron  Assessment/Plan:  Active Problems:   Cellulitis and abscess of foot   HTN (hypertension)   Cellulitis   PAD (peripheral artery disease)    Cellulitis involving Right Foot with Tinea It appears that this is predominantly due to PAD. Arterial dopplers suggest severe PAD. Discussed with Dr. Edilia Bo who wants to do an arteriogram with possible amputation of toes at [facility]. Will transfer to [facility]. Continue Vanc. Change Levaquin to Cefepime. Antifungal cream to areas between toes. No osteomyelitis seen on xray. Pain control. HIV is negative. Keep legs elevated.   Peripheral Artery Disease See above. Could possibly need bypass.  History of HTN  (hypertension) BP is stable. Since he will get contrast in AM for arteriogram will hold his ACEI and Diuretics. Add Amlodipine.  Homelessness Social work following.   Erectile Dysfucntion Patient asked to pursue this as OP. Possibly due to peripheral vascular disease. Will also need to have his testosterone levels checked but this has to be done as OP.  Code Status: Full Code  DVT Prophylaxis: Lovenox    Family Communication: Discussed with patient.  Disposition Plan: Transfer to [facility].   LOS: 3 days   [facility]  [facility] Pager [phone] 03/02/2013, 3:26 PM  If 8PM-8AM, please contact night-coverage at [email or web address], password [facility]

## 2013-03-02 NOTE — Consult Note (Signed)
[facility] [place]  Patient name: Wayne Davis MRN: [id number] DOB: [birth year] Sex: male  REASON FOR CONSULT: gangrene of right fifth toe with peripheral vascular disease. Consult from [facility].  HPI: Wayne Davis is a 47 y.o. male who developed a wound on his right fifth toe 2 months ago. He does not remember any specific injury to the foot. He denies fever or chills. He tells me that he was seen in the [facility] emergency department and was prescribed po antibiotics. According to the notes, he had not been taking the antibiotics. He was admitted on 02/28/2013 with cellulitis of the right foot area and he is being treated with IV antibiotics. He had a noninvasive Doppler study which suggested significant peripheral vascular disease and vascular surgery was consult for further recommendations.  Prior to this admission, he denies any history of claudication. He did describe some pain in his toes at night, possibly consistent with rest pain. Denies any previous history of nonhealing ulcers.    The patient does tell me that he had a stent placed in [place] approximately a year ago in his right lower extremity. He does not remember the details of this.  Past Medical History  Diagnosis Date  . Hypertension    He denies any history of diabetes, hypercholesterolemia, history of previous myocardial infarction, or history of congestive heart failure.  Family History  Problem Relation Age of Onset  . Family history unknown: Yes   He is unaware of any history of premature cardiovascular disease.  SOCIAL HISTORY: History  Substance Use Topics  . Smoking status: Current Every Day Smoker  . Smokeless tobacco: Never Used  . Alcohol Use: No   He is single. He has one child. He smokes half a pack per day of cigarettes and has been smoking for 15 years according to the patient  No Known Allergies  Current Facility-Administered  Medications  Medication Dose Route Frequency Provider Last Rate Last Dose  . acetaminophen (TYLENOL) tablet 650 mg  650 mg Oral Q6H PRN Therisa Doyne, MD       Or  . acetaminophen (TYLENOL) suppository 650 mg  650 mg Rectal Q6H PRN Therisa Doyne, MD      . docusate sodium (COLACE) capsule 100 mg  100 mg Oral BID Therisa Doyne, MD   100 mg at 03/02/13 7829  . enoxaparin (LOVENOX) injection 55 mg  55 mg Subcutaneous Q24H Therisa Doyne, MD   55 mg at 03/02/13 0921  . lisinopril (PRINIVIL,ZESTRIL) tablet 20 mg  20 mg Oral Daily Therisa Doyne, MD   20 mg at 03/02/13 5621   And  . hydrochlorothiazide (HYDRODIURIL) tablet 25 mg  25 mg Oral Daily Therisa Doyne, MD   25 mg at 03/02/13 3086  . HYDROcodone-acetaminophen (NORCO/VICODIN) 5-325 MG per tablet 1-2 tablet  1-2 tablet Oral Q4H PRN Therisa Doyne, MD   2 tablet at 03/02/13 0800  . HYDROmorphone (DILAUDID) injection 1-2 mg  1-2 mg Intravenous Q3H PRN Osvaldo Shipper, MD   2 mg at 03/02/13 1200  . ketoconazole (NIZORAL) 2 % cream   Topical BID Osvaldo Shipper, MD      . levofloxacin (LEVAQUIN) IVPB 500 mg  500 mg Intravenous Q24H Osvaldo Shipper, MD   500 mg at 03/02/13 5784  . ondansetron (ZOFRAN) tablet 4 mg  4 mg Oral Q6H PRN Therisa Doyne, MD       Or  . ondansetron (ZOFRAN) injection 4 mg  4 mg Intravenous Q6H  PRN Therisa Doyne, MD      . vancomycin (VANCOCIN) IVPB 1000 mg/200 mL premix  1,000 mg Intravenous Q12H Leann Trefz Poindexter, RPH   1,000 mg at 03/02/13 1610    REVIEW OF SYSTEMS: Arly.Keller ] denotes positive finding; [  ] denotes negative finding CARDIOVASCULAR:  [ ]  chest pain   [ ]  chest pressure   [ ]  palpitations   [ ]  orthopnea   [ ]  dyspnea on exertion   [ ]  claudication   Arly.Keller ] rest pain   [ ]  DVT   [ ]  phlebitis PULMONARY:   [ ]  productive cough   [ ]  asthma   [ ]  wheezing NEUROLOGIC:   [ ]  weakness  [ ]  paresthesias  [ ]  aphasia  [ ]  amaurosis  [ ]  dizziness HEMATOLOGIC:   [ ]  bleeding  problems   [ ]  clotting disorders MUSCULOSKELETAL:  [ ]  joint pain   [ ]  joint swelling [ ]  leg swelling GASTROINTESTINAL: [ ]   blood in stool  [ ]   hematemesis GENITOURINARY:  [ ]   dysuria  [ ]   hematuria PSYCHIATRIC:  [ ]  history of major depression INTEGUMENTARY:  [ ]  rashes  Arly.Keller ] ulcers CONSTITUTIONAL:  [ ]  fever   [ ]  chills  PHYSICAL EXAM: Filed Vitals:   03/01/13 1428 03/01/13 2159 03/02/13 0523 03/02/13 1354  BP: 118/62 144/84 137/90 119/85  Pulse: 73 89 79 90  Temp: 98.6 F (37 C) 98 F (36.7 C) 98.3 F (36.8 C) 97.9 F (36.6 C)  TempSrc: Oral Oral Oral   Resp: 14 16 16 16   Height:      Weight:      SpO2: 98% 96% 97% 93%   Body mass index is 32.99 kg/(m^2). GENERAL: The patient is a well-nourished male, in no acute distress. The vital signs are documented above. CARDIOVASCULAR: There is a regular rate and rhythm. I do not detect carotid bruits. He has palpable radial pulses bilaterally. He has palpable femoral pulses bilaterally. I cannot palpate popliteal, dorsalis pedis, or posterior tibial pulses bilaterally. PULMONARY: There is good air exchange bilaterally without wheezing or rales. ABDOMEN: Soft and non-tender with normal pitched bowel sounds. I cannot palpate an abdominal aortic aneurysm, however, he is somewhat obese and difficult to assess. MUSCULOSKELETAL: There are no major deformities. NEUROLOGIC: No focal weakness or paresthesias are detected. SKIN: He has a gangrenous wound on the medial aspect of his right fifth toe and also on the lateral aspect of his adjacent right fourth toe. Currently, there is no significant drainage. There is mild cellulitis. Right foot is somewhat swollen. There is a small crack in the right heel with no open ulcers noted. PSYCHIATRIC: The patient has a normal affect.  DATA:  Lab Results  Component Value Date   WBC 5.7 03/02/2013   HGB 13.1 03/02/2013   HCT 40.7 03/02/2013   MCV 80.9 03/02/2013   PLT 241 03/02/2013   Lab  Results  Component Value Date   NA 132* 03/02/2013   K 3.5 03/02/2013   CL 93* 03/02/2013   CO2 29 03/02/2013   Lab Results  Component Value Date   CREATININE 0.90 03/02/2013   ARTERIAL DOPPLER STUDY: I have independently interpreted his arterial Doppler study which shows severely dampened monophasic Doppler signals in the right dorsalis pedis and posterior tibial positions. ABI is 0.3. On the left side, he has severely dampened Doppler signal in the dorsalis pedis artery. A posterior tibial signal could not be obtained.  Ankle-brachial index on the left is 0.33.  CT SCAN OF THE ABDOMEN: This patient had a CT scan of the abdomen in August of 2014 to workup abdominal pain. I have reviewed this study. The patient has significant calcific disease in the right common iliac artery and it appears that he has a stent present here. In addition, it appears that he has a stent in the femoral artery on the right.  I have also reviewed the x-ray of his right foot which was performed on 02/24/2013. This did not show any evidence of osteomyelitis.  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 He has a gangrenous wound involving the right fourth and fifth toes. He has evidence of severe peripheral vascular disease with an ABI of only 30% on the right. He does not have adequate circulation to heal these wounds or to heal toe amputations on the right. This is clearly a limb threatening situation. I have recommended that we proceed with arteriography to see if he has any options for revascularization. I have discussed the case with Dr. Rito Ehrlich and he will arrange transfer the patient to [facility] at [facility]. I will schedule him for an arteriogram tomorrow by Dr. Imogene Burn. I have reviewed with the patient the indications for arteriography. In addition, I have reviewed the potential complications of arteriography  including but not limited to: bleeding, arterial injury, arterial thrombosis, dye action, renal insufficiency, or other unpredictable medical problems. I have explained to the patient that if we find disease amenable to angioplasty we could potentially address this at the same time. I have discussed the potential complications of angioplasty and stenting, including but not limited to: Bleeding, arterial thrombosis, arterial injury, dissection, or the need for surgical intervention.   Pending the results of his arteriogram, he could potentially be scheduled for a bypass and amputation of the right fourth and fifth toes on Friday. If he is a candidate for an endovascular approach to his infrainguinal arterial occlusive disease on the right, and he would simply require toe amputations on Friday. I have explained that we need to at least amputate the right fourth and fifth toes, but that sometimes the infection is more extensive than can be determined on physical exam and it he may require more extensive amputation of the foot. If there are no options for revascularization and he would ultimately likely require a below the knee amputation. We also discussed the importance of tobacco cessation. He will continue intravenous antibiotics which currently include Levaquin and vancomycin.   Larna Capelle S [facility] [place] Beeper: [phone]

## 2013-03-02 NOTE — Progress Notes (Signed)
ANTIBIOTIC CONSULT NOTE - FOLLOW UP  Pharmacy Consult for vancomycin/cefepime Indication: cellulitis  No Known Allergies  Patient Measurements: Height: 6\' 1"  (185.4 cm) Weight: 249 lb 15.7 oz (113.39 kg) IBW/kg (Calculated) : 79.9 Adjusted Body Weight:   Vital Signs: Temp: 97.9 F (36.6 C) (12/23 1354) Temp src: Oral (12/23 0523) BP: 119/85 mmHg (12/23 1354) Pulse Rate: 90 (12/23 1354) Intake/Output from previous day: 12/22 0701 - 12/23 0700 In: 1600 [P.O.:1080; I.V.:220; IV Piggyback:300] Out: 1400 [Urine:1400] Intake/Output from this shift: Total I/O In: 480 [P.O.:480] Out: 400 [Urine:400]  Labs:  Recent Labs  02/28/13 0644 03/01/13 0441 03/02/13 0452  WBC 7.6 6.1 5.7  HGB 14.3 14.2 13.1  PLT 260 270 241  CREATININE 0.87 0.92 0.90   Estimated Creatinine Clearance: 131 ml/min (by C-G formula based on Cr of 0.9).  Recent Labs  03/02/13 1538  VANCOTROUGH 6.4*     Microbiology: No results found for this or any previous visit (from the past 720 hour(s)).  Anti-infectives   Start     Dose/Rate Route Frequency Ordered Stop   02/28/13 1500  vancomycin (VANCOCIN) IVPB 1000 mg/200 mL premix     1,000 mg 200 mL/hr over 60 Minutes Intravenous Every 12 hours 02/28/13 0252     02/28/13 1000  levofloxacin (LEVAQUIN) IVPB 500 mg     500 mg 100 mL/hr over 60 Minutes Intravenous Every 24 hours 02/28/13 0914     02/28/13 0300  vancomycin (VANCOCIN) 2,000 mg in sodium chloride 0.9 % 500 mL IVPB     2,000 mg 250 mL/hr over 120 Minutes Intravenous  Once 02/28/13 0252 02/28/13 0536   02/27/13 2200  clindamycin (CLEOCIN) IVPB 600 mg     600 mg 100 mL/hr over 30 Minutes Intravenous  Once 02/27/13 2144 02/27/13 2318     Assessment: 47 yoM presented 12/20 with c/o R foot pain. Pt has been seen in the ED several times this month regarding R foot pain that started from the 5th digit and progressively worsened. Pt was discharged on Keflex 02/16/13 and Clinda 02/24/13 without  improvement. Pt filled prescription for Vicodin but not the abxs due to financial issues. Pt admitted for R foot cellulitis, abx changed to Vanc, Levaquin. No evidence of osteomyelitis on imaging studies.   12/20 >> Clinda x 1 12/21 >> Vancomycin >>  12/21 >> Levofloxacin >>  12/23 12/23 >> cefepime >>  Tmax: Afeb WBCs: wnl Renal: Scr 0.92, CG/N > 100 ml/min  12/21 HIV >> non-reactive No other cultures  Dose changes/drug level info:  12/23: VT @ 15:38 = 6.47mcg/ml on vancomycin 1gm IV q12h (prior to 6th dose)  Goal of Therapy:  Vancomycin trough level 10-15 mcg/ml  Plan:   Based on low vancomycin trough this afternoon, increase dose to 1500mg  IV q12h for estimated new vancomycin trough ~22mcg/ml  Patient to transfer to [facility] for arteriography tomorrow and likely amputation later this week  Appears transfer orders include changing levofloxacin to cefepime,  Start cefepime 2gm IV q12h  Juliette Alcide, PharmD, BCPS.   Pager: [phone]  03/02/2013,4:38 PM

## 2013-03-03 ENCOUNTER — Encounter (HOSPITAL_COMMUNITY)
Admission: EM | Disposition: A | Discharge status: Skilled Nursing Facility | Payer: Self-pay | Source: Home / Self Care | Attending: Internal Medicine

## 2013-03-03 ENCOUNTER — Ambulatory Visit (HOSPITAL_COMMUNITY): Admission: RE | Admit: 2013-03-03 | Payer: Medicaid Other | Source: Ambulatory Visit | Admitting: Vascular Surgery

## 2013-03-03 DIAGNOSIS — Z01810 Encounter for preprocedural cardiovascular examination: Secondary | ICD-10-CM

## 2013-03-03 HISTORY — PX: ABDOMINAL AORTAGRAM: SHX5454

## 2013-03-03 HISTORY — PX: LOWER EXTREMITY ANGIOGRAM: SHX5508

## 2013-03-03 LAB — CBC
HCT: 41.8 % (ref 39.0–52.0)
Hemoglobin: 14 g/dL (ref 13.0–17.0)
MCHC: 33.5 g/dL (ref 30.0–36.0)
MCV: 81.2 fL (ref 78.0–100.0)
Platelets: 269 10*3/uL (ref 150–400)
RBC: 5.15 MIL/uL (ref 4.22–5.81)
RDW: 16 % — ABNORMAL HIGH (ref 11.5–15.5)
WBC: 6.2 10*3/uL (ref 4.0–10.5)

## 2013-03-03 LAB — SURGICAL PCR SCREEN
MRSA, PCR: NEGATIVE
Staphylococcus aureus: NEGATIVE

## 2013-03-03 LAB — CREATININE, SERUM: GFR calc Af Amer: >90 mL/min (ref >90)

## 2013-03-03 SURGERY — ABDOMINAL AORTAGRAM
Anesthesia: LOCAL

## 2013-03-03 MED ORDER — SODIUM CHLORIDE 0.9 % IJ SOLN: 3.0000 mL | INTRAMUSCULAR | Status: DC | PRN

## 2013-03-03 MED ORDER — MIDAZOLAM HCL 2 MG/2ML IJ SOLN
INTRAMUSCULAR | Status: AC
  Filled 2013-03-03: qty 2

## 2013-03-03 MED ORDER — SODIUM CHLORIDE 0.9 % IV SOLN: 1.0000 mL/kg/h | INTRAVENOUS | Status: AC

## 2013-03-03 MED ORDER — SODIUM CHLORIDE 0.9 % IV SOLN
INTRAVENOUS | Status: DC
  Administered 2013-03-03 – 2013-03-05 (×3): via INTRAVENOUS

## 2013-03-03 MED ORDER — HYDROMORPHONE HCL PF 2 MG/ML IJ SOLN
INTRAMUSCULAR | Status: AC
  Filled 2013-03-03: qty 1

## 2013-03-03 MED ORDER — FENTANYL CITRATE 0.05 MG/ML IJ SOLN
INTRAMUSCULAR | Status: AC
  Filled 2013-03-03: qty 2

## 2013-03-03 MED ORDER — NICOTINE 14 MG/24HR TD PT24
14.0000 mg | MEDICATED_PATCH | Freq: Every day | TRANSDERMAL | Status: DC
  Filled 2013-03-03 (×3): qty 1

## 2013-03-03 MED ORDER — SODIUM CHLORIDE 0.9 % IV SOLN: 250.0000 mL | INTRAVENOUS | Status: DC | PRN

## 2013-03-03 MED ORDER — HYDROCODONE-ACETAMINOPHEN 5-325 MG PO TABS
2.0000 | ORAL_TABLET | ORAL | Status: DC | PRN
  Administered 2013-03-03 – 2013-03-05 (×9): 2 via ORAL
  Filled 2013-03-03 (×9): qty 2

## 2013-03-03 MED ORDER — ENOXAPARIN SODIUM 40 MG/0.4ML ~~LOC~~ SOLN: 40.0000 mg | SUBCUTANEOUS | Status: DC

## 2013-03-03 MED ORDER — HEPARIN (PORCINE) IN NACL 2-0.9 UNIT/ML-% IJ SOLN
INTRAMUSCULAR | Status: AC
  Filled 2013-03-03: qty 1000

## 2013-03-03 MED ORDER — LIDOCAINE HCL (PF) 1 % IJ SOLN
INTRAMUSCULAR | Status: AC
  Filled 2013-03-03: qty 30

## 2013-03-03 MED ORDER — AMLODIPINE BESYLATE 10 MG PO TABS
10.0000 mg | ORAL_TABLET | Freq: Every day | ORAL | Status: DC
  Administered 2013-03-04: 10 mg via ORAL
  Filled 2013-03-03: qty 1

## 2013-03-03 MED ORDER — SODIUM CHLORIDE 0.9 % IJ SOLN: 3.0000 mL | Freq: Two times a day (BID) | INTRAMUSCULAR | Status: DC

## 2013-03-03 NOTE — Interval H&P Note (Signed)
[facility] [place]  History and Physical Update  The patient was interviewed and re-examined.  The patient's previous History and Physical has been reviewed and is unchanged.  There is no change in the plan of care: Ao, B leg runoff.  BMET    Component Value Date/Time   NA 132* 03/02/2013 0452   K 3.5 03/02/2013 0452   CL 93* 03/02/2013 0452   CO2 29 03/02/2013 0452   GLUCOSE 187* 03/02/2013 0452   BUN 7 03/02/2013 0452   CREATININE 0.90 03/02/2013 0452   CALCIUM 8.8 03/02/2013 0452   GFRNONAA >90 03/02/2013 0452   GFRAA >90 03/02/2013 4098     Leonides Sake, MD [facility]: [phone] Pager: [phone]  03/03/2013, 12:44 PM

## 2013-03-03 NOTE — Progress Notes (Signed)
VASCULAR LAB PRELIMINARY  PRELIMINARY  PRELIMINARY  PRELIMINARY     Right Lower Extremity Vein Map    Right Great Saphenous Vein   Segment Diameter Comment  1. Origin 5.31mm   2. High Thigh 4.38mm   3. Mid Thigh 4.36mm   4. Low Thigh 4.65mm   5. At Knee 4.49mm   6. High Calf 4.74mm   7. Low Calf 4.43mm branch  8. Ankle 4.4mm    mm    mm    mm      Left Lower Extremity Vein Map    Left Great Saphenous Vein   Segment Diameter Comment  1. Origin 4.54mm   2. High Thigh 4.23mm   3. Mid Thigh 4.11mm   4. Low Thigh 4.19mm   5. At Knee 4.62mm   6. High Calf 4.75mm   7. Low Calf 2.40mm branch  8. Ankle 3.61mm    mm    mm    mm     Farrel Demark, RDMS, RVT  03/03/2013, 4:07 PM

## 2013-03-03 NOTE — H&P (View-Only) (Signed)
 [facility] [place]  Patient name: Wayne Davis MRN: [id number] DOB: 02/14/1966 Sex: male  REASON FOR CONSULT: gangrene of right fifth toe with peripheral vascular disease. Consult from [facility].  HPI: Wayne Davis is a 47 y.o. male who developed a wound on his right fifth toe 2 months ago. He does not remember any specific injury to the foot. He denies fever or chills. He tells me that he was seen in the [facility] emergency department and was prescribed po antibiotics. According to the notes, he had not been taking the antibiotics. He was admitted on 02/28/2013 with cellulitis of the right foot area and he is being treated with IV antibiotics. He had a noninvasive Doppler study which suggested significant peripheral vascular disease and vascular surgery was consult for further recommendations.  Prior to this admission, he denies any history of claudication. He did describe some pain in his toes at night, possibly consistent with rest pain. Denies any previous history of nonhealing ulcers.    The patient does tell me that he had a stent placed in [place] approximately a year ago in his right lower extremity. He does not remember the details of this.  Past Medical History  Diagnosis Date  . Hypertension    He denies any history of diabetes, hypercholesterolemia, history of previous myocardial infarction, or history of congestive heart failure.  Family History  Problem Relation Age of Onset  . Family history unknown: Yes   He is unaware of any history of premature cardiovascular disease.  SOCIAL HISTORY: History  Substance Use Topics  . Smoking status: Current Every Day Smoker  . Smokeless tobacco: Never Used  . Alcohol Use: No   He is single. He has one child. He smokes half a pack per day of cigarettes and has been smoking for 15 years according to the patient  No Known Allergies  Current Facility-Administered  Medications  Medication Dose Route Frequency Provider Last Rate Last Dose  . acetaminophen (TYLENOL) tablet 650 mg  650 mg Oral Q6H PRN Anastassia Doutova, MD       Or  . acetaminophen (TYLENOL) suppository 650 mg  650 mg Rectal Q6H PRN Anastassia Doutova, MD      . docusate sodium (COLACE) capsule 100 mg  100 mg Oral BID Anastassia Doutova, MD   100 mg at 03/02/13 0922  . enoxaparin (LOVENOX) injection 55 mg  55 mg Subcutaneous Q24H Anastassia Doutova, MD   55 mg at 03/02/13 0921  . lisinopril (PRINIVIL,ZESTRIL) tablet 20 mg  20 mg Oral Daily Anastassia Doutova, MD   20 mg at 03/02/13 0922   And  . hydrochlorothiazide (HYDRODIURIL) tablet 25 mg  25 mg Oral Daily Anastassia Doutova, MD   25 mg at 03/02/13 0922  . HYDROcodone-acetaminophen (NORCO/VICODIN) 5-325 MG per tablet 1-2 tablet  1-2 tablet Oral Q4H PRN Anastassia Doutova, MD   2 tablet at 03/02/13 0800  . HYDROmorphone (DILAUDID) injection 1-2 mg  1-2 mg Intravenous Q3H PRN Gokul Krishnan, MD   2 mg at 03/02/13 1200  . ketoconazole (NIZORAL) 2 % cream   Topical BID Gokul Krishnan, MD      . levofloxacin (LEVAQUIN) IVPB 500 mg  500 mg Intravenous Q24H Gokul Krishnan, MD   500 mg at 03/02/13 0922  . ondansetron (ZOFRAN) tablet 4 mg  4 mg Oral Q6H PRN Anastassia Doutova, MD       Or  . ondansetron (ZOFRAN) injection 4 mg  4 mg Intravenous Q6H   PRN Anastassia Doutova, MD      . vancomycin (VANCOCIN) IVPB 1000 mg/200 mL premix  1,000 mg Intravenous Q12H Leann Trefz Poindexter, RPH   1,000 mg at 03/02/13 0325    REVIEW OF SYSTEMS: [X ] denotes positive finding; [  ] denotes negative finding CARDIOVASCULAR:  [ ] chest pain   [ ] chest pressure   [ ] palpitations   [ ] orthopnea   [ ] dyspnea on exertion   [ ] claudication   [X ] rest pain   [ ] DVT   [ ] phlebitis PULMONARY:   [ ] productive cough   [ ] asthma   [ ] wheezing NEUROLOGIC:   [ ] weakness  [ ] paresthesias  [ ] aphasia  [ ] amaurosis  [ ] dizziness HEMATOLOGIC:   [ ] bleeding  problems   [ ] clotting disorders MUSCULOSKELETAL:  [ ] joint pain   [ ] joint swelling [ ] leg swelling GASTROINTESTINAL: [ ]  blood in stool  [ ]  hematemesis GENITOURINARY:  [ ]  dysuria  [ ]  hematuria PSYCHIATRIC:  [ ] history of major depression INTEGUMENTARY:  [ ] rashes  [X ] ulcers CONSTITUTIONAL:  [ ] fever   [ ] chills  PHYSICAL EXAM: Filed Vitals:   03/01/13 1428 03/01/13 2159 03/02/13 0523 03/02/13 1354  BP: 118/62 144/84 137/90 119/85  Pulse: 73 89 79 90  Temp: 98.6 F (37 C) 98 F (36.7 C) 98.3 F (36.8 C) 97.9 F (36.6 C)  TempSrc: Oral Oral Oral   Resp: 14 16 16 16  Height:      Weight:      SpO2: 98% 96% 97% 93%   Body mass index is 32.99 kg/(m^2). GENERAL: The patient is a well-nourished male, in no acute distress. The vital signs are documented above. CARDIOVASCULAR: There is a regular rate and rhythm. I do not detect carotid bruits. He has palpable radial pulses bilaterally. He has palpable femoral pulses bilaterally. I cannot palpate popliteal, dorsalis pedis, or posterior tibial pulses bilaterally. PULMONARY: There is good air exchange bilaterally without wheezing or rales. ABDOMEN: Soft and non-tender with normal pitched bowel sounds. I cannot palpate an abdominal aortic aneurysm, however, he is somewhat obese and difficult to assess. MUSCULOSKELETAL: There are no major deformities. NEUROLOGIC: No focal weakness or paresthesias are detected. SKIN: He has a gangrenous wound on the medial aspect of his right fifth toe and also on the lateral aspect of his adjacent right fourth toe. Currently, there is no significant drainage. There is mild cellulitis. Right foot is somewhat swollen. There is a small crack in the right heel with no open ulcers noted. PSYCHIATRIC: The patient has a normal affect.  DATA:  Lab Results  Component Value Date   WBC 5.7 03/02/2013   HGB 13.1 03/02/2013   HCT 40.7 03/02/2013   MCV 80.9 03/02/2013   PLT 241 03/02/2013   Lab  Results  Component Value Date   NA 132* 03/02/2013   K 3.5 03/02/2013   CL 93* 03/02/2013   CO2 29 03/02/2013   Lab Results  Component Value Date   CREATININE 0.90 03/02/2013   ARTERIAL DOPPLER STUDY: I have independently interpreted his arterial Doppler study which shows severely dampened monophasic Doppler signals in the right dorsalis pedis and posterior tibial positions. ABI is 0.3. On the left side, he has severely dampened Doppler signal in the dorsalis pedis artery. A posterior tibial signal could not be obtained.   Ankle-brachial index on the left is 0.33.  CT SCAN OF THE ABDOMEN: This patient had a CT scan of the abdomen in August of 2014 to workup abdominal pain. I have reviewed this study. The patient has significant calcific disease in the right common iliac artery and it appears that he has a stent present here. In addition, it appears that he has a stent in the femoral artery on the right.  I have also reviewed the x-ray of his right foot which was performed on 02/24/2013. This did not show any evidence of osteomyelitis.  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 He has a gangrenous wound involving the right fourth and fifth toes. He has evidence of severe peripheral vascular disease with an ABI of only 30% on the right. He does not have adequate circulation to heal these wounds or to heal toe amputations on the right. This is clearly a limb threatening situation. I have recommended that we proceed with arteriography to see if he has any options for revascularization. I have discussed the case with Dr. Krishnan and he will arrange transfer the patient to [facility] at [facility]. I will schedule him for an arteriogram tomorrow by Dr. Chen. I have reviewed with the patient the indications for arteriography. In addition, I have reviewed the potential complications of arteriography  including but not limited to: bleeding, arterial injury, arterial thrombosis, dye action, renal insufficiency, or other unpredictable medical problems. I have explained to the patient that if we find disease amenable to angioplasty we could potentially address this at the same time. I have discussed the potential complications of angioplasty and stenting, including but not limited to: Bleeding, arterial thrombosis, arterial injury, dissection, or the need for surgical intervention.   Pending the results of his arteriogram, he could potentially be scheduled for a bypass and amputation of the right fourth and fifth toes on Friday. If he is a candidate for an endovascular approach to his infrainguinal arterial occlusive disease on the right, and he would simply require toe amputations on Friday. I have explained that we need to at least amputate the right fourth and fifth toes, but that sometimes the infection is more extensive than can be determined on physical exam and it he may require more extensive amputation of the foot. If there are no options for revascularization and he would ultimately likely require a below the knee amputation. We also discussed the importance of tobacco cessation. He will continue intravenous antibiotics which currently include Levaquin and vancomycin.   DICKSON,CHRISTOPHER S [facility] [place] Beeper: [phone]   

## 2013-03-03 NOTE — Progress Notes (Signed)
[facility] PROGRESS NOTE  Wayne Davis AVW:[id number] DOB: [birth year] DOA: 02/27/2013 PCP: No PCP Per Patient   Brief narrative 47 y.o. male with a past medical history of Hypertension and active smoker. He presented with a 2 month history of foot pain starting with infection of right 5th toe that progressed due to medication noncompliance due to financial issues. He was admitted for management of foot cellulitis with concern for gangrene.  Assessment/Plan: Peripheral Vascular disease with gangrene of right foot . Arterial dopplers suggest severe PAD. Aortogram done by vascular surgery today which shows occluded right  anterior tibial and  popliteal artery. Recommend that patient will need a right common femoral artery to below-the-knee popliteal bypass.  -continue  vancomycin and cefepime -Pain control Nicotine patch ordered   History of HTN (hypertension)    hold his ACEI and Diuretics as patient getting contrast today. Added Amlodipine. Would add when necessary hydralazine  Homelessness  Social work following.   Erectile Dysfucntion  Possibly due to peripheral vascular disease. Can be worked up as outpatient  Code Status: Full Code  DVT Prophylaxis: Lovenox  Family Communication: Discussed with patient.  Disposition Plan: Home once stable     HPI/Subjective: Patient complains of pain over his right foot.  Objective: Filed Vitals:   03/03/13 1338  BP:   Pulse: 83  Temp:   Resp:     Intake/Output Summary (Last 24 hours) at 03/03/13 1513 Last data filed at 03/03/13 1251  Gross per 24 hour  Intake    600 ml  Output    550 ml  Net     50 ml   Filed Weights   02/28/13 0153  Weight: 113.39 kg (249 lb 15.7 oz)    Exam:   General:  Middle aged obese male in no acute distress  HEENT: No pallor, muscle to mucosa  Chest: Good loss condition bilaterally, nitroglycerin sounds  CVS: Normal S1 and S2, no murmurs rub or gallop  Abdomen: Soft,  nontender, nondistended, bowel sounds present  Extremities: Swelling over her right foot with cellulitis involving the right foot and toes with swelling  Absent tibial and popliteal pulses  CNS: AAO x3  Data Reviewed: Basic Metabolic Panel:  Recent Labs Lab 02/27/13 2225 02/28/13 0644 03/01/13 0441 03/02/13 0452  NA 139 135 134* 132*  K 3.7 3.5 3.7 3.5  CL 100 99 94* 93*  CO2 28 26 28 29   GLUCOSE 92 153* 133* 187*  BUN 15 15 8 7   CREATININE 0.94 0.87 0.92 0.90  CALCIUM 9.8 8.8 9.1 8.8  MG  --  1.9  --   --   PHOS  --  3.4  --   --    Liver Function Tests:  Recent Labs Lab 02/28/13 0644  AST 25  ALT 32  ALKPHOS 71  BILITOT 0.6  PROT 7.5  ALBUMIN 3.5   No results found for this basename: LIPASE, AMYLASE,  in the last 168 hours No results found for this basename: AMMONIA,  in the last 168 hours CBC:  Recent Labs Lab 02/27/13 2225 02/28/13 0644 03/01/13 0441 03/02/13 0452  WBC 7.4 7.6 6.1 5.7  NEUTROABS 3.5  --   --   --   HGB 15.6 14.3 14.2 13.1  HCT 47.0 42.5 43.6 40.7  MCV 79.9 79.9 81.3 80.9  PLT 313 260 270 241   Cardiac Enzymes: No results found for this basename: CKTOTAL, CKMB, CKMBINDEX, TROPONINI,  in the last 168 hours BNP (last 3 results)  No results found for this basename: PROBNP,  in the last 8760 hours CBG: No results found for this basename: GLUCAP,  in the last 168 hours  Recent Results (from the past 240 hour(s))  SURGICAL PCR SCREEN     Status: None   Collection Time    03/03/13  7:06 AM      Result Value Range Status   MRSA, PCR NEGATIVE  NEGATIVE Final   Staphylococcus aureus NEGATIVE  NEGATIVE Final   Comment:            The Xpert SA Assay (FDA     approved for NASAL specimens     in patients over 22 years of age),     is one component of     a comprehensive surveillance     program.  Test performance has     been validated by [employer] for patients greater     than or equal to 81 year old.     It is not intended      to diagnose infection nor to     guide or monitor treatment.     Studies: No results found.  Scheduled Meds: . [MAR HOLD] amLODipine  5 mg Oral Daily  . [MAR HOLD] ceFEPime (MAXIPIME) IV  2 g Intravenous Q12H  . [facility] HOLD] docusate sodium  100 mg Oral BID  . [MAR HOLD] enoxaparin (LOVENOX) injection  55 mg Subcutaneous Q24H  . [MAR HOLD] ketoconazole   Topical BID  . sodium chloride  3 mL Intravenous Q12H  . [employer] HOLD] vancomycin  1,500 mg Intravenous Q12H   Continuous Infusions: . sodium chloride    . sodium chloride 1 mL/kg/hr (03/03/13 1501)      Time spent: 25 minutes    Wayne Davis  [facility] Pager [phone]. If 7PM-7AM, please contact night-coverage at [email or web address], password [facility] 03/03/2013, 3:13 PM  LOS: 4 days

## 2013-03-03 NOTE — Op Note (Signed)
OPERATIVE NOTE   PROCEDURE: 1.  Left common femoral artery cannulation under ultrasound guidance 2.  Placement of catheter in aorta 3.  Aortogram 4.  Second order arterial selection 5.  Right leg runoff 6.  Left leg runoff  PRE-OPERATIVE DIAGNOSIS: Right foot gangrene  POST-OPERATIVE DIAGNOSIS: same as above   SURGEON: Leonides Sake, MD  ANESTHESIA: conscious sedation  ESTIMATED BLOOD LOSS: 30 cc  CONTRAST: 200 cc  FINDING(S):  Aorta:  patent  Superior mesenteric artery: Patent  Right Left  RA Patent Patent  CIA Patent Patent  EIA Patent Patent  IIA Occluded Occluded  CFA Patent Patent  SFA Occluded fully stented superficial femoral artery  Occluded except small island distally  PFA Patent Patent  Pop Occluded proximally, Patent distal to knee hoint Occluded  Trif Patent Reconstituted tibioperoneal trunk from two geniculate collaterals  AT Patent Occluded  Pero Patent Patent  PT Patent Patent   SPECIMEN(S):  none  INDICATIONS:   Wayne Davis is a 47 y.o. male who presents with right foot gangrene.  The patient presents for: aortogram, bilateral leg runoff, and possible intervention.  I discussed with the patient the nature of angiographic procedures, especially the limited patencies of any endovascular intervention.  The patient is aware of that the risks of an angiographic procedure include but are not limited to: bleeding, infection, access site complications, renal failure, embolization, rupture of vessel, dissection, possible need for emergent surgical intervention, possible need for surgical procedures to treat the patient's pathology, and stroke and death.  The patient is aware of the risks and agrees to proceed.  DESCRIPTION: After full informed consent was obtained from the patient, the patient was brought back to the angiography suite.  The patient was placed supine upon the angiography table and connected to monitoring equipment.  The patient  was then given conscious sedation, the amounts of which are documented in the patient's chart.  The patient was prepped and drape in the standard fashion for an angiographic procedure.  At this point, attention was turned to the left groin.  Under ultrasound guidance, the left common femoral artery was cannulated with a micropuncture needle.  The microwire was advanced into the iliac arterial system.  The needle was exchanged for a microsheath, which was loaded into the common femoral artery over the wire.  The microwire was exchanged for a [facility] wire which was advanced into the aorta.  The microsheath was then exchanged for a 5-Fr sheath which was loaded into the common femoral artery. The Omniflush catheter was then loaded over the wire up to the level of L1.  The catheter was connected to the power injector circuit.  After de-airring and de-clotting the circuit, a power injector aortogram was completed.  The findings are as listed above.  The [facility] wire was replaced in the catheter, and using the [place] and Omniflush catheter, the right common iliac artery was selected.  The catheter and wire were advanced into the external iliac artery.  An automated right leg runoff was completed after de-airring and de-clotting the circuit.  The findings are as listed above.  The [facility] wire was replaced in the catheter to straighten the crook of the catheter.  Both were removed together from the sheath.  The left sheath was aspirated and no clot was present.  The sheath was connected to the power injector circuit.  An automated left leg runoff was completed after de-airring and de-clotting the circuit.  The findings are as listed above.  The sheath was aspirated.  No clots were present and the sheath was reloaded with heparinized saline.  Based on the images, this patient needs: Right common femoral artery to below-the-knee popliteal bypass for his right foot gangrene.  At some point, the patient might need a left common  femoral artery to tibioperoneal trunk bypass.    COMPLICATIONS: none  CONDITION: stable   Leonides Sake, MD [facility] [place] Office: [phone] Pager: [phone]  03/03/2013, 2:05 PM

## 2013-03-03 NOTE — Progress Notes (Signed)
    VASCULAR PROGRESS NOTE  SUBJECTIVE: still with pain in right fifth toe.  PHYSICAL EXAM: Filed Vitals:   03/02/13 1354 03/02/13 2025 03/02/13 2111 03/03/13 0502  BP: 119/85 159/103 159/103 150/67  Pulse: 90 103  96  Temp: 97.9 F (36.6 C) 98.6 F (37 C)  98.2 F (36.8 C)  TempSrc:      Resp: 16 21  18   Height:      Weight:      SpO2: 93% 96%  94%   No change in exam. Right foot slightly cool and swollen with gangrenous changes of the right fourth and fifth toes.  LABS: Lab Results  Component Value Date   WBC 5.7 03/02/2013   HGB 13.1 03/02/2013   HCT 40.7 03/02/2013   MCV 80.9 03/02/2013   PLT 241 03/02/2013   Lab Results  Component Value Date   CREATININE 0.90 03/02/2013   Active Problems:   Cellulitis and abscess of foot   HTN (hypertension)   Cellulitis   PAD (peripheral artery disease)  ASSESSMENT AND PLAN:  For arteriogram today. Further recommendations pending these results.  Cari Caraway Beeper: [phone] 03/03/2013

## 2013-03-04 ENCOUNTER — Inpatient Hospital Stay (HOSPITAL_COMMUNITY): Payer: Medicaid Other

## 2013-03-04 ENCOUNTER — Other Ambulatory Visit: Payer: Self-pay

## 2013-03-04 LAB — HEMOGLOBIN A1C
Hgb A1c MFr Bld: 7.1 % — ABNORMAL HIGH (ref <5.7)
Mean Plasma Glucose: 157 mg/dL — ABNORMAL HIGH (ref <117)

## 2013-03-04 LAB — VANCOMYCIN, TROUGH: Vancomycin Tr: 13.5 ug/mL (ref 10.0–20.0)

## 2013-03-04 NOTE — Progress Notes (Addendum)
   Preoperative Note   Procedure: R CFA to BK pop bypass with ips GSV, R 4th and 5th toe amputation  Date: 03/05/13  Preoperative diagnosis: R foot gangrene  Consent: on chart  Laboratory:  CBC:    Component Value Date/Time   WBC 6.2 03/03/2013 1740   RBC 5.15 03/03/2013 1740   HGB 14.0 03/03/2013 1740   HCT 41.8 03/03/2013 1740   PLT 269 03/03/2013 1740   MCV 81.2 03/03/2013 1740   MCH 27.2 03/03/2013 1740   MCHC 33.5 03/03/2013 1740   RDW 16.0* 03/03/2013 1740   LYMPHSABS 2.9 02/27/2013 2225   MONOABS 0.6 02/27/2013 2225   EOSABS 0.3 02/27/2013 2225   BASOSABS 0.0 02/27/2013 2225    BMP:    Component Value Date/Time   NA 132* 03/02/2013 0452   K 3.5 03/02/2013 0452   CL 93* 03/02/2013 0452   CO2 29 03/02/2013 0452   GLUCOSE 187* 03/02/2013 0452   BUN 7 03/02/2013 0452   CREATININE 0.88 03/03/2013 1740   CALCIUM 8.8 03/02/2013 0452   GFRNONAA >90 03/03/2013 1740   GFRAA >90 03/03/2013 1740    Coagulation: No results found for this basename: INR, PROTIME   No results found for this basename: PTT   Type and screen: ordered for 03/05/13  EKG: will obtain  CXR: will obtain   Leonides Sake, MD [facility] [place] Office: [phone] Pager: [phone]  03/04/2013, 8:39 AM

## 2013-03-04 NOTE — Progress Notes (Signed)
[facility] PROGRESS NOTE  Wayne Davis Allan NFA:[id number] DOB: 25-Dec-1965 DOA: 02/27/2013 PCP: No PCP Per Patient  Brief narrative  47 y.o. male with a past medical history of Hypertension and active smoker. He presented with a 2 month history of foot pain starting with infection of right 5th toe that progressed due to medication noncompliance due to financial issues. He was admitted for management of foot cellulitis with concern for gangrene.   Assessment/Plan:  Peripheral Vascular disease with gangrene of right foot  . Arterial dopplers suggest severe PAD. Aortogram done by vascular surgery today which shows occluded right anterior tibial and popliteal artery. Recommend that patient will need a right common femoral artery to below-the-knee popliteal bypass. Planned for tomorrow. -continue vancomycin and cefepime  -Pain control  Nicotine patch ordered .  History of HTN (hypertension)  BP low this afternoon. Hold all BP meds. Given IV NS bolus.  Homelessness  Social work following.   Erectile Dysfucntion  Possibly due to peripheral vascular disease. Can be worked up as outpatient   Code Status: Full Code   DVT Prophylaxis: Lovenox   Family Communication: Discussed with patient.   Disposition Plan: Home once stable    HPI/Subjective:  Patient complains of pain over his right foot.   Objective: Filed Vitals:   03/04/13 1326  BP: 138/70  Pulse: 84  Temp: 97.2 F (36.2 C)  Resp: 19    Intake/Output Summary (Last 24 hours) at 03/04/13 1350 Last data filed at 03/04/13 1000  Gross per 24 hour  Intake    720 ml  Output   1300 ml  Net   -580 ml   Filed Weights   02/28/13 0153 03/04/13 0104  Weight: 113.39 kg (249 lb 15.7 oz) 110.6 kg (243 lb 13.3 oz)    Exam: General: Middle aged obese male in no acute distress  HEENT: No pallor, muscle to mucosa  Chest: clear bilaterally,   CVS: Normal S1 and S2, no murmurs rub or gallop  Abdomen: Soft,  nontender, nondistended, bowel sounds present  Extremities: Swelling over her right foot with cellulitis involving the right foot and toes with swelling Absent tibial and popliteal pulses  CNS: AAO x3   Data Reviewed: Basic Metabolic Panel:  Recent Labs Lab 02/27/13 2225 02/28/13 0644 03/01/13 0441 03/02/13 0452 03/03/13 1740  NA 139 135 134* 132*  --   K 3.7 3.5 3.7 3.5  --   CL 100 99 94* 93*  --   CO2 28 26 28 29   --   GLUCOSE 92 153* 133* 187*  --   BUN 15 15 8 7   --   CREATININE 0.94 0.87 0.92 0.90 0.88  CALCIUM 9.8 8.8 9.1 8.8  --   MG  --  1.9  --   --   --   PHOS  --  3.4  --   --   --    Liver Function Tests:  Recent Labs Lab 02/28/13 0644  AST 25  ALT 32  ALKPHOS 71  BILITOT 0.6  PROT 7.5  ALBUMIN 3.5   No results found for this basename: LIPASE, AMYLASE,  in the last 168 hours No results found for this basename: AMMONIA,  in the last 168 hours CBC:  Recent Labs Lab 02/27/13 2225 02/28/13 0644 03/01/13 0441 03/02/13 0452 03/03/13 1740  WBC 7.4 7.6 6.1 5.7 6.2  NEUTROABS 3.5  --   --   --   --   HGB 15.6 14.3 14.2 13.1 14.0  HCT 47.0 42.5 43.6 40.7 41.8  MCV 79.9 79.9 81.3 80.9 81.2  PLT 313 260 270 241 269   Cardiac Enzymes: No results found for this basename: CKTOTAL, CKMB, CKMBINDEX, TROPONINI,  in the last 168 hours BNP (last 3 results) No results found for this basename: PROBNP,  in the last 8760 hours CBG: No results found for this basename: GLUCAP,  in the last 168 hours  Recent Results (from the past 240 hour(s))  SURGICAL PCR SCREEN     Status: None   Collection Time    03/03/13  7:06 AM      Result Value Range Status   MRSA, PCR NEGATIVE  NEGATIVE Final   Staphylococcus aureus NEGATIVE  NEGATIVE Final   Comment:            The Xpert SA Assay (FDA     approved for NASAL specimens     in patients over 73 years of age),     is one component of     a comprehensive surveillance     program.  Test performance has     been  validated by [employer] for patients greater     than or equal to 54 year old.     It is not intended     to diagnose infection nor to     guide or monitor treatment.     Studies: Dg Chest 2 View  03/04/2013   CLINICAL DATA:  Preop  EXAM: CHEST  2 VIEW  COMPARISON:  None.  FINDINGS: Normal heart size. Clear lungs. Postoperative changes in the right suprahilar region. Status post sternotomy. No pneumothorax. Chronic pleural changes at the lung apices.  IMPRESSION: No active cardiopulmonary disease.   Electronically Signed   By: Maryclare Bean M.D.   On: 03/04/2013 09:50    Scheduled Meds: . amLODipine  10 mg Oral Daily  . ceFEPime (MAXIPIME) IV  2 g Intravenous Q12H  . docusate sodium  100 mg Oral BID  . enoxaparin (LOVENOX) injection  55 mg Subcutaneous Q24H  . ketoconazole   Topical BID  . nicotine  14 mg Transdermal Daily  . vancomycin  1,500 mg Intravenous Q12H   Continuous Infusions: . sodium chloride 100 mL/hr at 03/04/13 1048      Time spent: 25 minutes    Kaycee Mcgaugh  [facility] Pager [phone]. If 7PM-7AM, please contact night-coverage at [email or web address], password [facility] 03/04/2013, 1:50 PM  LOS: 5 days

## 2013-03-05 ENCOUNTER — Encounter (HOSPITAL_COMMUNITY): Payer: Self-pay | Admitting: Certified Registered Nurse Anesthetist

## 2013-03-05 ENCOUNTER — Encounter (HOSPITAL_COMMUNITY): Payer: Medicaid Other | Admitting: Certified Registered Nurse Anesthetist

## 2013-03-05 ENCOUNTER — Inpatient Hospital Stay (HOSPITAL_COMMUNITY): Payer: Medicaid Other

## 2013-03-05 ENCOUNTER — Telehealth: Payer: Self-pay | Admitting: Vascular Surgery

## 2013-03-05 ENCOUNTER — Encounter (HOSPITAL_COMMUNITY)
Admission: EM | Disposition: A | Discharge status: Skilled Nursing Facility | Payer: Self-pay | Source: Home / Self Care | Attending: Internal Medicine

## 2013-03-05 ENCOUNTER — Inpatient Hospital Stay (HOSPITAL_COMMUNITY): Payer: Medicaid Other | Admitting: Certified Registered Nurse Anesthetist

## 2013-03-05 DIAGNOSIS — I70269 Atherosclerosis of native arteries of extremities with gangrene, unspecified extremity: Secondary | ICD-10-CM

## 2013-03-05 HISTORY — PX: AMPUTATION: SHX166

## 2013-03-05 HISTORY — PX: FEMORAL-POPLITEAL BYPASS GRAFT: SHX937

## 2013-03-05 LAB — TYPE AND SCREEN: ABO/RH(D): A POS

## 2013-03-05 LAB — BASIC METABOLIC PANEL
Calcium: 8.5 mg/dL (ref 8.4–10.5)
Creatinine, Ser: 0.81 mg/dL (ref 0.50–1.35)
GFR calc Af Amer: >90 mL/min (ref >90)
Sodium: 141 mEq/L (ref 135–145)

## 2013-03-05 LAB — CBC
Hemoglobin: 13.1 g/dL (ref 13.0–17.0)
MCH: 26.4 pg (ref 26.0–34.0)
MCV: 81 fL (ref 78.0–100.0)
Platelets: 266 10*3/uL (ref 150–400)
RDW: 16.2 % — ABNORMAL HIGH (ref 11.5–15.5)
WBC: 5.1 10*3/uL (ref 4.0–10.5)

## 2013-03-05 LAB — ABO/RH: ABO/RH(D): A POS

## 2013-03-05 SURGERY — BYPASS GRAFT FEMORAL-POPLITEAL ARTERY
Anesthesia: General | Site: Toe | Laterality: Right

## 2013-03-05 MED ORDER — PROTAMINE SULFATE 10 MG/ML IV SOLN
INTRAVENOUS | Status: DC | PRN
  Administered 2013-03-05: 30 mg via INTRAVENOUS

## 2013-03-05 MED ORDER — GUAIFENESIN-DM 100-10 MG/5ML PO SYRP
15.0000 mL | ORAL_SOLUTION | ORAL | Status: DC | PRN
  Administered 2013-03-07: 5 mL via ORAL

## 2013-03-05 MED ORDER — ENOXAPARIN SODIUM 60 MG/0.6ML ~~LOC~~ SOLN
55.0000 mg | SUBCUTANEOUS | Status: DC
  Filled 2013-03-05: qty 0.6

## 2013-03-05 MED ORDER — METOPROLOL TARTRATE 1 MG/ML IV SOLN: 2.0000 mg | INTRAVENOUS | Status: DC | PRN

## 2013-03-05 MED ORDER — HYDROMORPHONE HCL PF 1 MG/ML IJ SOLN
0.2500 mg | INTRAMUSCULAR | Status: DC | PRN
  Administered 2013-03-05 (×4): 0.5 mg via INTRAVENOUS

## 2013-03-05 MED ORDER — MORPHINE SULFATE 2 MG/ML IJ SOLN
2.0000 mg | INTRAMUSCULAR | Status: DC | PRN
  Administered 2013-03-05: 4 mg via INTRAVENOUS
  Administered 2013-03-10: 2 mg via INTRAVENOUS
  Filled 2013-03-05: qty 1
  Filled 2013-03-05: qty 2

## 2013-03-05 MED ORDER — PHENYLEPHRINE HCL 10 MG/ML IJ SOLN
INTRAMUSCULAR | Status: DC | PRN
  Administered 2013-03-05 (×3): 40 ug via INTRAVENOUS

## 2013-03-05 MED ORDER — SODIUM CHLORIDE 0.9 % IR SOLN
Status: DC | PRN
  Administered 2013-03-05: 08:00:00

## 2013-03-05 MED ORDER — NICOTINE 14 MG/24HR TD PT24
14.0000 mg | MEDICATED_PATCH | Freq: Every day | TRANSDERMAL | Status: DC
  Administered 2013-03-08: 14 mg via TRANSDERMAL
  Filled 2013-03-05 (×6): qty 1

## 2013-03-05 MED ORDER — FENTANYL CITRATE 0.05 MG/ML IJ SOLN
INTRAMUSCULAR | Status: DC | PRN
  Administered 2013-03-05 (×4): 50 ug via INTRAVENOUS
  Administered 2013-03-05 (×2): 100 ug via INTRAVENOUS
  Administered 2013-03-05 (×7): 50 ug via INTRAVENOUS

## 2013-03-05 MED ORDER — DEXAMETHASONE SODIUM PHOSPHATE 4 MG/ML IJ SOLN
INTRAMUSCULAR | Status: DC | PRN
  Administered 2013-03-05: 4 mg via INTRAVENOUS

## 2013-03-05 MED ORDER — MIDAZOLAM HCL 5 MG/5ML IJ SOLN
INTRAMUSCULAR | Status: DC | PRN
  Administered 2013-03-05: 2 mg via INTRAVENOUS
  Administered 2013-03-05 (×2): 1 mg via INTRAVENOUS

## 2013-03-05 MED ORDER — SODIUM CHLORIDE 0.9 % IV SOLN: 500.0000 mL | Freq: Once | INTRAVENOUS | Status: AC | PRN

## 2013-03-05 MED ORDER — DOPAMINE-DEXTROSE 3.2-5 MG/ML-% IV SOLN: 3.0000 ug/kg/min | INTRAVENOUS | Status: DC

## 2013-03-05 MED ORDER — THROMBIN 20000 UNITS EX SOLR
CUTANEOUS | Status: AC
  Filled 2013-03-05: qty 20000

## 2013-03-05 MED ORDER — VECURONIUM BROMIDE 10 MG IV SOLR
INTRAVENOUS | Status: DC | PRN
  Administered 2013-03-05 (×2): 4 mg via INTRAVENOUS

## 2013-03-05 MED ORDER — LABETALOL HCL 5 MG/ML IV SOLN
10.0000 mg | INTRAVENOUS | Status: DC | PRN
  Administered 2013-03-05: 10 mg via INTRAVENOUS
  Filled 2013-03-05: qty 4

## 2013-03-05 MED ORDER — PANTOPRAZOLE SODIUM 40 MG PO TBEC
40.0000 mg | DELAYED_RELEASE_TABLET | Freq: Every day | ORAL | Status: DC
  Administered 2013-03-06 – 2013-03-10 (×5): 40 mg via ORAL
  Filled 2013-03-05 (×5): qty 1

## 2013-03-05 MED ORDER — HYDROMORPHONE HCL PF 1 MG/ML IJ SOLN: 0.2500 mg | INTRAMUSCULAR | Status: DC | PRN

## 2013-03-05 MED ORDER — PAPAVERINE HCL 30 MG/ML IJ SOLN
INTRAMUSCULAR | Status: DC | PRN
  Administered 2013-03-05: 60 mg

## 2013-03-05 MED ORDER — HEPARIN SODIUM (PORCINE) 1000 UNIT/ML IJ SOLN
INTRAMUSCULAR | Status: DC | PRN
  Administered 2013-03-05: 4000 [IU] via INTRAVENOUS
  Administered 2013-03-05: 5000 [IU] via INTRAVENOUS

## 2013-03-05 MED ORDER — POTASSIUM CHLORIDE CRYS ER 20 MEQ PO TBCR: 20.0000 meq | EXTENDED_RELEASE_TABLET | Freq: Every day | ORAL | Status: DC | PRN

## 2013-03-05 MED ORDER — ONDANSETRON HCL 4 MG/2ML IJ SOLN
INTRAMUSCULAR | Status: DC | PRN
  Administered 2013-03-05: 4 mg via INTRAVENOUS

## 2013-03-05 MED ORDER — HYDROMORPHONE HCL PF 1 MG/ML IJ SOLN
INTRAMUSCULAR | Status: AC
  Filled 2013-03-05: qty 1

## 2013-03-05 MED ORDER — GLYCOPYRROLATE 0.2 MG/ML IJ SOLN
INTRAMUSCULAR | Status: DC | PRN
  Administered 2013-03-05: 0.6 mg via INTRAVENOUS

## 2013-03-05 MED ORDER — PHENOL 1.4 % MT LIQD: 1.0000 | OROMUCOSAL | Status: DC | PRN

## 2013-03-05 MED ORDER — HYDROMORPHONE HCL PF 1 MG/ML IJ SOLN
INTRAMUSCULAR | Status: DC | PRN
  Administered 2013-03-05 (×2): 0.5 mg via INTRAVENOUS

## 2013-03-05 MED ORDER — PAPAVERINE HCL 30 MG/ML IJ SOLN
INTRAMUSCULAR | Status: AC
  Filled 2013-03-05: qty 2

## 2013-03-05 MED ORDER — IOHEXOL 300 MG/ML  SOLN
INTRAMUSCULAR | Status: DC | PRN
  Administered 2013-03-05: 20 mL via INTRAVENOUS

## 2013-03-05 MED ORDER — LORAZEPAM 0.5 MG PO TABS
0.5000 mg | ORAL_TABLET | Freq: Once | ORAL | Status: AC
  Administered 2013-03-05: 0.5 mg via ORAL
  Filled 2013-03-05: qty 1

## 2013-03-05 MED ORDER — ACETAMINOPHEN 650 MG RE SUPP: 650.0000 mg | Freq: Four times a day (QID) | RECTAL | Status: DC | PRN

## 2013-03-05 MED ORDER — LIDOCAINE HCL (CARDIAC) 20 MG/ML IV SOLN
INTRAVENOUS | Status: DC | PRN
  Administered 2013-03-05: 50 mg via INTRAVENOUS

## 2013-03-05 MED ORDER — ALBUTEROL SULFATE HFA 108 (90 BASE) MCG/ACT IN AERS
INHALATION_SPRAY | RESPIRATORY_TRACT | Status: DC | PRN
  Administered 2013-03-05: 4 via RESPIRATORY_TRACT

## 2013-03-05 MED ORDER — PROPOFOL 10 MG/ML IV BOLUS
INTRAVENOUS | Status: DC | PRN
  Administered 2013-03-05: 20 mg via INTRAVENOUS
  Administered 2013-03-05: 10 mg via INTRAVENOUS
  Administered 2013-03-05: 20 mg via INTRAVENOUS
  Administered 2013-03-05: 50 mg via INTRAVENOUS
  Administered 2013-03-05: 150 mg via INTRAVENOUS

## 2013-03-05 MED ORDER — ROCURONIUM BROMIDE 100 MG/10ML IV SOLN
INTRAVENOUS | Status: DC | PRN
  Administered 2013-03-05: 50 mg via INTRAVENOUS

## 2013-03-05 MED ORDER — HYDROMORPHONE HCL PF 1 MG/ML IJ SOLN
1.0000 mg | INTRAMUSCULAR | Status: DC | PRN
  Administered 2013-03-05: 2 mg via INTRAVENOUS
  Administered 2013-03-06: 1 mg via INTRAVENOUS
  Administered 2013-03-06 (×8): 2 mg via INTRAVENOUS
  Administered 2013-03-06: 1 mg via INTRAVENOUS
  Administered 2013-03-06 – 2013-03-07 (×6): 2 mg via INTRAVENOUS
  Administered 2013-03-07: 1 mg via INTRAVENOUS
  Filled 2013-03-05 (×17): qty 2

## 2013-03-05 MED ORDER — KETOCONAZOLE 2 % EX CREA
TOPICAL_CREAM | Freq: Two times a day (BID) | CUTANEOUS | Status: DC
  Administered 2013-03-07 – 2013-03-08 (×3): via TOPICAL
  Filled 2013-03-05: qty 15

## 2013-03-05 MED ORDER — DOCUSATE SODIUM 100 MG PO CAPS
100.0000 mg | ORAL_CAPSULE | Freq: Two times a day (BID) | ORAL | Status: DC
  Administered 2013-03-06 – 2013-03-10 (×7): 100 mg via ORAL
  Filled 2013-03-05 (×12): qty 1

## 2013-03-05 MED ORDER — DOCUSATE SODIUM 100 MG PO CAPS: 100.0000 mg | ORAL_CAPSULE | Freq: Two times a day (BID) | ORAL | Status: DC

## 2013-03-05 MED ORDER — LACTATED RINGERS IV SOLN
INTRAVENOUS | Status: DC | PRN
  Administered 2013-03-05: 07:00:00 via INTRAVENOUS

## 2013-03-05 MED ORDER — NEOSTIGMINE METHYLSULFATE 1 MG/ML IJ SOLN
INTRAMUSCULAR | Status: DC | PRN
  Administered 2013-03-05: 4 mg via INTRAVENOUS

## 2013-03-05 MED ORDER — SUCCINYLCHOLINE CHLORIDE 20 MG/ML IJ SOLN
INTRAMUSCULAR | Status: DC | PRN
  Administered 2013-03-05: 100 mg via INTRAVENOUS

## 2013-03-05 MED ORDER — HYDROMORPHONE HCL PF 1 MG/ML IJ SOLN
1.0000 mg | INTRAMUSCULAR | Status: DC | PRN
  Administered 2013-03-05: 1 mg via INTRAVENOUS
  Administered 2013-03-05: 2 mg via INTRAVENOUS
  Administered 2013-03-05: 1 mg via INTRAVENOUS
  Administered 2013-03-05 (×2): 2 mg via INTRAVENOUS
  Filled 2013-03-05 (×3): qty 2
  Filled 2013-03-05: qty 1
  Filled 2013-03-05: qty 2

## 2013-03-05 MED ORDER — 0.9 % SODIUM CHLORIDE (POUR BTL) OPTIME
TOPICAL | Status: DC | PRN
  Administered 2013-03-05: 2000 mL

## 2013-03-05 MED ORDER — HYDROCODONE-ACETAMINOPHEN 5-325 MG PO TABS
2.0000 | ORAL_TABLET | ORAL | Status: DC | PRN
  Administered 2013-03-05 – 2013-03-10 (×18): 2 via ORAL
  Filled 2013-03-05 (×18): qty 2

## 2013-03-05 MED ORDER — ACETAMINOPHEN 325 MG PO TABS
650.0000 mg | ORAL_TABLET | Freq: Four times a day (QID) | ORAL | Status: DC | PRN
  Administered 2013-03-06: 650 mg via ORAL
  Filled 2013-03-05: qty 2

## 2013-03-05 MED ORDER — ENOXAPARIN SODIUM 60 MG/0.6ML ~~LOC~~ SOLN
55.0000 mg | SUBCUTANEOUS | Status: DC
  Administered 2013-03-08 – 2013-03-10 (×2): 55 mg via SUBCUTANEOUS
  Filled 2013-03-05 (×5): qty 0.6

## 2013-03-05 MED ORDER — LABETALOL HCL 5 MG/ML IV SOLN
INTRAVENOUS | Status: AC
  Filled 2013-03-05: qty 4

## 2013-03-05 MED ORDER — ONDANSETRON HCL 4 MG/2ML IJ SOLN: 4.0000 mg | Freq: Four times a day (QID) | INTRAMUSCULAR | Status: DC | PRN

## 2013-03-05 MED ORDER — VANCOMYCIN HCL 10 G IV SOLR
1500.0000 mg | Freq: Two times a day (BID) | INTRAVENOUS | Status: DC
  Administered 2013-03-06 – 2013-03-10 (×10): 1500 mg via INTRAVENOUS
  Filled 2013-03-05 (×13): qty 1500

## 2013-03-05 MED ORDER — ONDANSETRON HCL 4 MG PO TABS: 4.0000 mg | ORAL_TABLET | Freq: Four times a day (QID) | ORAL | Status: DC | PRN

## 2013-03-05 MED ORDER — DEXTROSE 5 % IV SOLN
2.0000 g | Freq: Two times a day (BID) | INTRAVENOUS | Status: DC
  Administered 2013-03-05 – 2013-03-10 (×10): 2 g via INTRAVENOUS
  Filled 2013-03-05 (×13): qty 2

## 2013-03-05 MED ORDER — ALUM & MAG HYDROXIDE-SIMETH 200-200-20 MG/5ML PO SUSP: 15.0000 mL | ORAL | Status: DC | PRN

## 2013-03-05 MED ORDER — LACTATED RINGERS IV SOLN
INTRAVENOUS | Status: DC | PRN
  Administered 2013-03-05 (×2): via INTRAVENOUS

## 2013-03-05 MED ORDER — HYDRALAZINE HCL 20 MG/ML IJ SOLN: 10.0000 mg | Freq: Four times a day (QID) | INTRAMUSCULAR | Status: DC | PRN

## 2013-03-05 SURGICAL SUPPLY — 72 items
BANDAGE CONFORM 3  STR LF (GAUZE/BANDAGES/DRESSINGS) ×3 IMPLANT
BANDAGE ELASTIC 4 VELCRO ST LF (GAUZE/BANDAGES/DRESSINGS) ×6 IMPLANT
BANDAGE ESMARK 6X9 LF (GAUZE/BANDAGES/DRESSINGS) ×2 IMPLANT
BANDAGE GAUZE ELAST BULKY 4 IN (GAUZE/BANDAGES/DRESSINGS) ×3 IMPLANT
BLADE 15 SAFETY STRL DISP (BLADE) ×3 IMPLANT
BLADE AVERAGE 25X9 (BLADE) ×3 IMPLANT
BNDG ESMARK 6X9 LF (GAUZE/BANDAGES/DRESSINGS) ×3
BNDG GAUZE ELAST 4 BULKY (GAUZE/BANDAGES/DRESSINGS) ×6 IMPLANT
CANISTER SUCTION 2500CC (MISCELLANEOUS) ×3 IMPLANT
CANNULA VESSEL 3MM 2 BLNT TIP (CANNULA) ×3 IMPLANT
CLIP TI MEDIUM 24 (CLIP) ×6 IMPLANT
CLIP TI WIDE RED SMALL 24 (CLIP) ×12 IMPLANT
COVER SURGICAL LIGHT HANDLE (MISCELLANEOUS) ×3 IMPLANT
CUFF TOURNIQUET SINGLE 24IN (TOURNIQUET CUFF) IMPLANT
CUFF TOURNIQUET SINGLE 34IN LL (TOURNIQUET CUFF) ×3 IMPLANT
CUFF TOURNIQUET SINGLE 44IN (TOURNIQUET CUFF) IMPLANT
DERMABOND ADVANCED (GAUZE/BANDAGES/DRESSINGS) ×2
DERMABOND ADVANCED .7 DNX12 (GAUZE/BANDAGES/DRESSINGS) ×4 IMPLANT
DRAIN CHANNEL 15F RND FF W/TCR (WOUND CARE) ×3 IMPLANT
DRAPE EXTREMITY T 121X128X90 (DRAPE) ×3 IMPLANT
DRAPE WARM FLUID 44X44 (DRAPE) ×3 IMPLANT
DRAPE X-RAY CASS 24X20 (DRAPES) ×3 IMPLANT
DRSG COVADERM 4X10 (GAUZE/BANDAGES/DRESSINGS) IMPLANT
DRSG COVADERM 4X8 (GAUZE/BANDAGES/DRESSINGS) IMPLANT
ELECT REM PT RETURN 9FT ADLT (ELECTROSURGICAL) ×3
ELECTRODE REM PT RTRN 9FT ADLT (ELECTROSURGICAL) ×2 IMPLANT
EVACUATOR SILICONE 100CC (DRAIN) ×3 IMPLANT
GLOVE BIO SURGEON STRL SZ7.5 (GLOVE) ×3 IMPLANT
GLOVE BIOGEL PI IND STRL 8 (GLOVE) ×2 IMPLANT
GLOVE BIOGEL PI INDICATOR 8 (GLOVE) ×1
GOWN STRL NON-REIN LRG LVL3 (GOWN DISPOSABLE) ×12 IMPLANT
KIT BASIN OR (CUSTOM PROCEDURE TRAY) ×3 IMPLANT
KIT ROOM TURNOVER OR (KITS) ×3 IMPLANT
MARKER GRAFT CORONARY BYPASS (MISCELLANEOUS) ×3 IMPLANT
MARKER SKIN DUAL TIP RULER LAB (MISCELLANEOUS) ×3 IMPLANT
NEEDLE 18GX1X1/2 (RX/OR ONLY) (NEEDLE) ×3 IMPLANT
NS IRRIG 1000ML POUR BTL (IV SOLUTION) ×6 IMPLANT
PACK GENERAL/GYN (CUSTOM PROCEDURE TRAY) ×3 IMPLANT
PACK PERIPHERAL VASCULAR (CUSTOM PROCEDURE TRAY) ×3 IMPLANT
PAD ARMBOARD 7.5X6 YLW CONV (MISCELLANEOUS) ×6 IMPLANT
PADDING CAST COTTON 6X4 STRL (CAST SUPPLIES) ×3 IMPLANT
SET COLLECT BLD 21X3/4 12 (NEEDLE) ×3 IMPLANT
SPECIMEN JAR SMALL (MISCELLANEOUS) ×3 IMPLANT
SPONGE GAUZE 4X4 12PLY (GAUZE/BANDAGES/DRESSINGS) ×3 IMPLANT
SPONGE SURGIFOAM ABS GEL 100 (HEMOSTASIS) IMPLANT
STAPLER VISISTAT 35W (STAPLE) ×3 IMPLANT
STOPCOCK 4 WAY LG BORE MALE ST (IV SETS) ×3 IMPLANT
SUT ETHILON 3 0 PS 1 (SUTURE) ×3 IMPLANT
SUT PROLENE 5 0 C 1 24 (SUTURE) ×6 IMPLANT
SUT PROLENE 6 0 BV (SUTURE) ×9 IMPLANT
SUT PROLENE 7 0 BV 1 (SUTURE) IMPLANT
SUT SILK 2 0 FS (SUTURE) ×3 IMPLANT
SUT SILK 2 0 SH (SUTURE) ×6 IMPLANT
SUT SILK 3 0 (SUTURE) ×2
SUT SILK 3-0 18XBRD TIE 12 (SUTURE) ×4 IMPLANT
SUT VIC AB 2-0 CTB1 (SUTURE) ×6 IMPLANT
SUT VIC AB 3-0 SH 27 (SUTURE) ×4
SUT VIC AB 3-0 SH 27X BRD (SUTURE) ×8 IMPLANT
SUT VICRYL 4-0 PS2 18IN ABS (SUTURE) ×12 IMPLANT
SWAB COLLECTION DEVICE MRSA (MISCELLANEOUS) IMPLANT
SYR 20CC LL (SYRINGE) ×3 IMPLANT
SYR 20ML ECCENTRIC (SYRINGE) ×6 IMPLANT
SYR 30ML SLIP (SYRINGE) ×3 IMPLANT
SYR 5ML LL (SYRINGE) ×3 IMPLANT
SYRINGE 10CC LL (SYRINGE) ×3 IMPLANT
TOWEL OR 17X24 6PK STRL BLUE (TOWEL DISPOSABLE) ×12 IMPLANT
TOWEL OR 17X26 10 PK STRL BLUE (TOWEL DISPOSABLE) ×6 IMPLANT
TRAY FOLEY CATH 16FRSI W/METER (SET/KITS/TRAYS/PACK) ×3 IMPLANT
TUBE ANAEROBIC SPECIMEN COL (MISCELLANEOUS) IMPLANT
TUBING EXTENTION W/L.L. (IV SETS) ×3 IMPLANT
UNDERPAD 30X30 INCONTINENT (UNDERPADS AND DIAPERS) ×3 IMPLANT
WATER STERILE IRR 1000ML POUR (IV SOLUTION) ×3 IMPLANT

## 2013-03-05 NOTE — Op Note (Signed)
NAME: Wayne Davis    MRN: [id number] DOB: [birth year]    DATE OF OPERATION: 03/05/2013  PREOP DIAGNOSIS: Gangrene of the right fifth toe with infrainguinal arterial occlusive disease  POSTOP DIAGNOSIS: same  PROCEDURE:  1. Right femoral to below knee popliteal artery bypass with nonreversible translocated saphenous vein graft 2. Intraoperative arteriogram 3. Ray amputation of the right fifth toe  SURGEON: Wayne Davis. Wayne Bo, MD, FACS  ASSIST: Wayne Massed, PA  ANESTHESIA: Gen.   EBL: 100 cc  INDICATIONS: Wayne Davis is a 47 y.o. male who presented with a gangrenous wound of his right fifth toe. He had undergone previous iliac angioplasty and angioplasty and stenting of his entire superficial femoral artery in [place]. This was all occluded. Arteriogram showed that there was reconstitution of the below-knee popliteal artery. It was felt that his best chance for limb salvage was attempted femoropopliteal bypass grafting and amputation of the fifth toe and possibly the fourth toe.  FINDINGS: the patient had diffuse thickening of the common femoral artery also of the below-knee pop 2 artery. Completion arteriogram showed no technical problems.  TECHNIQUE: The patient was taken to the operative room and received a general anesthetic. The right lower extremity and right foot were prepped and draped in usual sterile fashion. A longitudinal incision was made in the right groin and the dissection was carried down to the common femoral artery which was dissected free up to the inguinal ligament. The patient had been stented from the distal common femoral artery all the way down to the popliteal artery and therefore I had to clamp above the level of the stent in the common femoral artery. Through the same incision the saphenofemoral junction was dissected free. Using 3 additional incisions along the medial aspect of the right leg the saphenous vein was harvested from  the groin to the midcalf with branches divided between clips and 3-0 silk ties. Through the distal incision, the below-knee pop artery was exposed. The popliteal vein was retracted inferiorly to allow exposure of the artery. A tunnel was created from this incision to the groin incision and the patient was heparinized. The saphenous vein was removed entirely from its bed after it was ligated distally. The saphenofemoral junction was clamped and the saphenous vein excised from the femoral vein. The femoral vein was oversewn with a 5-0 Prolene suture. Next a proximal valve was sharply excised and the vein. The common femoral artery was clamped proximally and distally and a longitudinal arteriotomy was made. The artery was somewhat thick but patent. The vein was sewn in a nonreversible fashion end to side to the common femoral artery after a radiopaque marker was placed around the proximal anastomosis. Prior to completing this anastomosis the inflow was tested and there was good inflow. The anastomosis was completed.  The anterograde mills valvulotome was used to cut the valves. The vein was then marked with a twisting. There was excellent flow through the graft. This was flushed with heparinized saline and then clipped. He was then brought to the previously created tunnel for anastomosis to the below knee popliteal artery. Tourniquet was placed on the upper thigh. The leg was exsanguinated with an Esmarch bandage and the tourniquet inflated to 300 mm mercury. Under tourniquet control, a longitudinal arteriotomy was made in the below knee popliteal artery. The artery here was thick but patent. The vein was cut to the appropriate length, spatulated, and sewn end-to-side to the below knee popliteal artery using a continuous 6-0  Prolene suture. Prior to completing this anastomosis, the artery was backbled and flushed appropriate and the anastomosis completed. It was a good anterior tibial and posterior tibial signal at  the completion. Intraoperative arteriogram was obtained by cannulating the proximal vein and this showed no technical problems. Hemostasis was then obtained in the wounds. A 15 Blake drain was placed in the below the knee incision. This wound was closed deeply with 3-0 Vicryl, subcutaneous layer 3-0 Vicryl, and the skin closed with a 4-0 subcuticular stitch. The 2 thigh incisions were closed with deep layer 3-0 Vicryl, subcutaneous layer with 3-0 Vicryl, and the skin closed with 40 septic her stitch. The groin incision was closed with a deep layer of 2-0 Vicryl, subcutaneous layer 3:00 on the skin closed with 4-0 subcuticular stitch. The radiopaque marker was secured to the proximal anastomosis with a 6-0 Prolene. Dermabond was applied.  Next attention was turned to the right foot. An elliptical incision was made encompassing the right fifth toe and this was carried down to the metatarsal which was divided using a bone cutter. The adjacent fourth toe had some blistering of  the skin but this appeared to be intact and therefore I elected to give him the benefit of the doubt and not take the fourth toe. There was good bleeding. Hemostasis was obtained. He was to much tension to close this primarily I therefore placed 2 sutures in the lateral aspect of the incision and left the remaining part of the wound open. This was packed with a moist 4 x 4 and then a sterile dressing was applied. The patient tolerated the procedure well and was transferred to the recovery room in stable condition. All needle and sponge counts were correct.  Wayne Ferrari, MD, FACS [facility]  DATE OF DICTATION:   03/05/2013

## 2013-03-05 NOTE — Interval H&P Note (Signed)
History and Physical Interval Note:  03/05/2013 7:24 AM  Wayne Davis  has presented today for surgery, with the diagnosis of ISCHEMIC RIGHT LEG AND GANGRENE TOES  The various methods of treatment have been discussed with the patient and family. After consideration of risks, benefits and other options for treatment, the patient has consented to  Procedure(s): BYPASS GRAFT FEMORAL-POPLITEAL ARTERY (Right) AMPUTATION DIGIT (Right) as a surgical intervention .  The patient's history has been reviewed, patient examined, no change in status, stable for surgery.  I have reviewed the patient's chart and labs.  Questions were answered to the patient's satisfaction.     Haeleigh Streiff S

## 2013-03-05 NOTE — Progress Notes (Signed)
ANTIBIOTIC CONSULT NOTE - FOLLOW UP  Pharmacy Consult for Cefepime/Vanco Indication: R toe gangrene  No Known Allergies  Patient Measurements: Height: 6\' 1"  (185.4 cm) Weight: 243 lb 13.3 oz (110.6 kg) IBW/kg (Calculated) : 79.9 Adjusted Body Weight:   Vital Signs: Temp: 98.9 F (37.2 C) (12/26 1245) Temp src: Oral (12/26 0500) BP: 153/73 mmHg (12/26 0500) Pulse Rate: 78 (12/26 0500) Intake/Output from previous day: 12/25 0701 - 12/26 0700 In: 3018.3 [P.O.:480; I.V.:1988.3; IV Piggyback:550] Out: 500 [Urine:500] Intake/Output from this shift: Total I/O In: 2500 [I.V.:2500] Out: 750 [Urine:600; Other:100; Blood:50]  Labs:  Recent Labs  03/03/13 1740 03/05/13 0543  WBC 6.2 5.1  HGB 14.0 13.1  PLT 269 266  CREATININE 0.88 0.81   Estimated Creatinine Clearance: 143.9 ml/min (by C-G formula based on Cr of 0.81).  Recent Labs  03/02/13 1538 03/04/13 1158  VANCOTROUGH 6.4* 13.5     Microbiology: Recent Results (from the past 720 hour(s))  SURGICAL PCR SCREEN     Status: None   Collection Time    03/03/13  7:06 AM      Result Value Range Status   MRSA, PCR NEGATIVE  NEGATIVE Final   Staphylococcus aureus NEGATIVE  NEGATIVE Final   Comment:            The Xpert SA Assay (FDA     approved for NASAL specimens     in patients over 65 years of age),     is one component of     a comprehensive surveillance     program.  Test performance has     been validated by [employer] for patients greater     than or equal to 9 year old.     It is not intended     to diagnose infection nor to     guide or monitor treatment.    Anti-infectives   Start     Dose/Rate Route Frequency Ordered Stop   03/03/13 0000  [MAR Hold]  vancomycin (VANCOCIN) 1,500 mg in sodium chloride 0.9 % 500 mL IVPB     (On MAR Hold since 03/05/13 0641)   1,500 mg 250 mL/hr over 120 Minutes Intravenous Every 12 hours 03/02/13 1654     03/02/13 2000  [MAR Hold]  ceFEPIme (MAXIPIME) 2 g in  dextrose 5 % 50 mL IVPB     (On MAR Hold since 03/05/13 0641)   2 g 100 mL/hr over 30 Minutes Intravenous Every 12 hours 03/02/13 1710     02/28/13 1500  vancomycin (VANCOCIN) IVPB 1000 mg/200 mL premix  Status:  Discontinued     1,000 mg 200 mL/hr over 60 Minutes Intravenous Every 12 hours 02/28/13 0252 03/02/13 1654   02/28/13 1000  levofloxacin (LEVAQUIN) IVPB 500 mg  Status:  Discontinued     500 mg 100 mL/hr over 60 Minutes Intravenous Every 24 hours 02/28/13 0914 03/02/13 2009   02/28/13 0300  vancomycin (VANCOCIN) 2,000 mg in sodium chloride 0.9 % 500 mL IVPB     2,000 mg 250 mL/hr over 120 Minutes Intravenous  Once 02/28/13 0252 02/28/13 0536   02/27/13 2200  clindamycin (CLEOCIN) IVPB 600 mg     600 mg 100 mL/hr over 30 Minutes Intravenous  Once 02/27/13 2144 02/27/13 2318      Assessment: CC: foot pain  AC: None PTA, lovenox 0.5mg /kg/day for VTE prophylaxis=55mg /24h  ID: Vanc D#6/cefepime D#4 for cellulitis/ulcer (failed Keflex and Clinda) - Afebrile, WBC 5.1, no cultures.  12/24 arterial dopplers suggest severe PAD. Aortogram done shows occluded R anterior tibial and popliteal arter, will need femoral artery to below the knee popliteal bypass. WBC wnl, afebrile 12/26: R fem-pop BPG, arteriogram, R 5th toe amputation  Clinda x 1 12/20 Vanc 12/21>> LVQ 12/21 >> 12/23 Cefepime 12/23>>  12/23 VT @ 15:38 = 6.31mcg/ml on vancomycin 1gm IV q12h (prior to 6th dose) 12/25 VT @ 1158 = 13.33mcg/ml on vancomycin 1500 mg IV q12h  CV: Hx HTN - BP 168/94, HR 92   Endo: No hx - AM gluc 103  GI/Nutr: NPO - LFTs WNL  Renal: Scr 0.81, lytes ok  Pulm: 94% RA  Heme/Onc: CBC WNL  Best practices: lovenox  Goal of Therapy:  Vancomycin trough level 10-15 mcg/ml  Plan:  1. Continue cefepime 2gm IV Q12H 2. Continue Vancomycin 1500mg  IV Q12H, last VT 13.5 (no note) 3. F/u renal fxn, C&S, clinical status, trough if renal function changes    Anis Degidio S. Merilynn Finland, PharmD,  BCPS Clinical Staff Pharmacist Pager [phone]  Misty Stanley Stillinger 03/05/2013,1:34 PM

## 2013-03-05 NOTE — Progress Notes (Signed)
   VASCULAR POST OP NOTE  SUBJECTIVE: Pain adequately controlled.   PHYSICAL EXAM: Filed Vitals:   03/05/13 1352 03/05/13 1407 03/05/13 1436 03/05/13 1503  BP: 151/88 165/94  163/84  Pulse: 93 88  92  Temp:   98.6 F (37 C) 98.4 F (36.9 C)  TempSrc:    Oral  Resp: 18 18  19   Height:    6\' 1"  (1.854 m)  Weight:    263 lb 10.7 oz (119.6 kg)  SpO2: 95% 98%  96%   Brisk ATA signal with doppler. Foot hyperemic  LABS: Lab Results  Component Value Date   WBC 5.1 03/05/2013   HGB 13.1 03/05/2013   HCT 40.2 03/05/2013   MCV 81.0 03/05/2013   PLT 266 03/05/2013   Lab Results  Component Value Date   CREATININE 0.81 03/05/2013   Active Problems:   Cellulitis and abscess of foot   HTN (hypertension)   Cellulitis   PAD (peripheral artery disease)   ASSESSMENT AND PLAN:  * Stable post op.    Cari Caraway Beeper: [phone] 03/05/2013

## 2013-03-05 NOTE — Anesthesia Preprocedure Evaluation (Addendum)
Anesthesia Evaluation  Patient identified by MRN, date of birth, ID band Patient awake    Reviewed: Allergy & Precautions, H&P , NPO status , Patient's Chart, lab work & pertinent test results  Airway Mallampati: III TM Distance: >3 FB Neck ROM: Full    Dental  (+) Dental Advisory Given   Pulmonary Current Smoker,  breath sounds clear to auscultation        Cardiovascular hypertension, Pt. on medications + Peripheral Vascular Disease Rhythm:Regular Rate:Normal     Neuro/Psych negative neurological ROS     GI/Hepatic negative GI ROS, Neg liver ROS,   Endo/Other  Morbid obesity  Renal/GU negative Renal ROS     Musculoskeletal negative musculoskeletal ROS (+)   Abdominal (+) + obese,   Peds  Hematology negative hematology ROS (+)   Anesthesia Other Findings   Reproductive/Obstetrics                          Anesthesia Physical Anesthesia Plan  ASA: III  Anesthesia Plan: General   Post-op Pain Management:    Induction: Intravenous  Airway Management Planned: Oral ETT  Additional Equipment:   Intra-op Plan:   Post-operative Plan: Extubation in OR  Informed Consent: I have reviewed the patients History and Physical, chart, labs and discussed the procedure including the risks, benefits and alternatives for the proposed anesthesia with the patient or authorized representative who has indicated his/her understanding and acceptance.   Dental advisory given  Plan Discussed with: CRNA, Anesthesiologist and Surgeon  Anesthesia Plan Comments:        Anesthesia Quick Evaluation

## 2013-03-05 NOTE — Anesthesia Postprocedure Evaluation (Signed)
  Anesthesia Post-op Note  Patient: Wayne Davis  Procedure(s) Performed: Procedure(s): RIGHT FEMORAL-POPLITEAL ARTERY BYPASS GRAFT USING NON REVERSE RIGHT GREATER SAPHENOUS VEIN WITH INTRAOPERATIVE ARTERIOGRAM TIMES ONE (Right) AMPUTATION  RIGHT 5TH TOE  (Right)  Patient Location: PACU  Anesthesia Type:General  Level of Consciousness: awake  Airway and Oxygen Therapy: Patient Spontanous Breathing  Post-op Pain: mild  Post-op Assessment: Post-op Vital signs reviewed  Post-op Vital Signs: Reviewed  Complications: No apparent anesthesia complications

## 2013-03-05 NOTE — H&P (View-Only) (Signed)
    VASCULAR PROGRESS NOTE  SUBJECTIVE: still with pain in right fifth toe.  PHYSICAL EXAM: Filed Vitals:   03/02/13 1354 03/02/13 2025 03/02/13 2111 03/03/13 0502  BP: 119/85 159/103 159/103 150/67  Pulse: 90 103  96  Temp: 97.9 F (36.6 C) 98.6 F (37 C)  98.2 F (36.8 C)  TempSrc:      Resp: 16 21  18  Height:      Weight:      SpO2: 93% 96%  94%   No change in exam. Right foot slightly cool and swollen with gangrenous changes of the right fourth and fifth toes.  LABS: Lab Results  Component Value Date   WBC 5.7 03/02/2013   HGB 13.1 03/02/2013   HCT 40.7 03/02/2013   MCV 80.9 03/02/2013   PLT 241 03/02/2013   Lab Results  Component Value Date   CREATININE 0.90 03/02/2013   Active Problems:   Cellulitis and abscess of foot   HTN (hypertension)   Cellulitis   PAD (peripheral artery disease)  ASSESSMENT AND PLAN:  For arteriogram today. Further recommendations pending these results.  Wayne Davis Beeper: [phone] 03/03/2013    

## 2013-03-05 NOTE — Progress Notes (Signed)
[facility] PROGRESS NOTE  Wayne Davis ZOX:[id number] DOB: April 06, 1965 DOA: 02/27/2013 PCP: No PCP Per Patient  Brief narrative  47 y.o. male with a past medical history of Hypertension and active smoker. He presented with a 2 month history of foot pain starting with infection of right 5th toe that progressed due to medication noncompliance due to financial issues. He was admitted for management of foot cellulitis with concern for gangrene.   Assessment/Plan:  Peripheral Vascular disease with gangrene of right foot  . Arterial dopplers suggest severe PAD. Aortogram done by vascular surgery today which shows occluded right anterior tibial and popliteal artery.  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 Tolerated sx well. -continue vancomycin and cefepime  -Pain control    History of HTN (hypertension)  BP low this afternoon. Hold all BP meds. Given IV NS bolus.   Homelessness  Social work following.   Erectile Dysfucntion  Possibly due to peripheral vascular disease. Can be worked up as outpatient   Tobacco use Counseled on smoking cessation  ordered nicotine patch  Code Status: Full Code   DVT Prophylaxis: Lovenox   Family Communication: Discussed with patient.   Disposition Plan: Home once stable . PT eval   HPI/Subjective:  Patient see after returning from OR. No overnight issues   Objective: Filed Vitals:   03/05/13 1503  BP: 163/84  Pulse: 92  Temp: 98.4 F (36.9 C)  Resp: 19    Intake/Output Summary (Last 24 hours) at 03/05/13 1614 Last data filed at 03/05/13 1516  Gross per 24 hour  Intake   7095 ml  Output   1425 ml  Net   5670 ml   Filed Weights   02/28/13 0153 03/04/13 0104 03/05/13 1503  Weight: 113.39 kg (249 lb 15.7 oz) 110.6 kg (243 lb 13.3 oz) 119.6 kg (263 lb 10.7 oz)    Exam:  General: Middle aged obese male in no acute distress  HEENT: No pallor, moist mucosa   Chest: clear bilaterally,  CVS: Normal S1 and S2, no murmurs rub or gallop  Abdomen: Soft, nontender, nondistended, bowel sounds present  Extremities: dressing over right foot. CNS: AAO x3   Data Reviewed: Basic Metabolic Panel:  Recent Labs Lab 02/27/13 2225 02/28/13 0644 03/01/13 0441 03/02/13 0452 03/03/13 1740 03/05/13 0543  NA 139 135 134* 132*  --  141  K 3.7 3.5 3.7 3.5  --  3.7  CL 100 99 94* 93*  --  103  CO2 28 26 28 29   --  28  GLUCOSE 92 153* 133* 187*  --  103*  BUN 15 15 8 7   --  8  CREATININE 0.94 0.87 0.92 0.90 0.88 0.81  CALCIUM 9.8 8.8 9.1 8.8  --  8.5  MG  --  1.9  --   --   --   --   PHOS  --  3.4  --   --   --   --    Liver Function Tests:  Recent Labs Lab 02/28/13 0644  AST 25  ALT 32  ALKPHOS 71  BILITOT 0.6  PROT 7.5  ALBUMIN 3.5   No results found for this basename: LIPASE, AMYLASE,  in the last 168 hours No results found for this basename: AMMONIA,  in the last 168 hours CBC:  Recent Labs Lab 02/27/13 2225 02/28/13 0644 03/01/13 0441 03/02/13 0452 03/03/13 1740 03/05/13 0543  WBC 7.4 7.6 6.1 5.7 6.2 5.1  NEUTROABS 3.5  --   --   --   --   --   HGB 15.6 14.3 14.2 13.1 14.0 13.1  HCT 47.0 42.5 43.6 40.7 41.8 40.2  MCV 79.9 79.9 81.3 80.9 81.2 81.0  PLT 313 260 270 241 269 266   Cardiac Enzymes: No results found for this basename: CKTOTAL, CKMB, CKMBINDEX, TROPONINI,  in the last 168 hours BNP (last 3 results) No results found for this basename: PROBNP,  in the last 8760 hours CBG: No results found for this basename: GLUCAP,  in the last 168 hours  Recent Results (from the past 240 hour(s))  SURGICAL PCR SCREEN     Status: None   Collection Time    03/03/13  7:06 AM      Result Value Range Status   MRSA, PCR NEGATIVE  NEGATIVE Final   Staphylococcus aureus NEGATIVE  NEGATIVE Final   Comment:            The Xpert SA Assay (FDA     approved for NASAL specimens     in patients over 60 years of age),     is one  component of     a comprehensive surveillance     program.  Test performance has     been validated by [employer] for patients greater     than or equal to 63 year old.     It is not intended     to diagnose infection nor to     guide or monitor treatment.     Studies: Dg Chest 2 View  03/04/2013   CLINICAL DATA:  Preop  EXAM: CHEST  2 VIEW  COMPARISON:  None.  FINDINGS: Normal heart size. Clear lungs. Postoperative changes in the right suprahilar region. Status post sternotomy. No pneumothorax. Chronic pleural changes at the lung apices.  IMPRESSION: No active cardiopulmonary disease.   Electronically Signed   By: Maryclare Bean M.D.   On: 03/04/2013 09:50   Dg Ang/ext/uni/or Right  03/05/2013   CLINICAL DATA:  Right femoral to popliteal artery bypass for right 5th toe gangrene with infrainguinal peripheral vascular disease.  EXAM: RIGHT ANG/EXT/UNI/ OR  TECHNIQUE: Intraoperative image was obtained.  CONTRAST:  See operative documentation  COMPARISON:  None  FLUOROSCOPY TIME:  See operative documentation  FINDINGS: Intraoperative image obtained in a lateral projection shows a saphenous vein bypass graft with anastomosis to the below knee popliteal artery. Distal anastomosis appears normally patent. Three-vessel patent runoff is demonstrated into the distal calf. No filling defects or extravasation identified.  IMPRESSION: Imaging during bypass graft surgery shows a patent distal graft, patent distal anastomosis to the popliteal artery below the knee and patent 3 vessel runoff.   Electronically Signed   By: Irish Lack M.D.   On: 03/05/2013 12:53    Scheduled Meds: . [MAR HOLD] ceFEPime (MAXIPIME) IV  2 g Intravenous Q12H  . [facility] HOLD] docusate sodium  100 mg Oral BID  . [START ON 03/06/2013] enoxaparin (LOVENOX) injection  55 mg Subcutaneous Q24H  . HYDROmorphone      . HYDROmorphone      . [MAR HOLD] ketoconazole   Topical BID  . labetalol      . [facility] HOLD] nicotine  14 mg  Transdermal Daily  . pantoprazole  40 mg Oral Daily  . [facility] HOLD] vancomycin  1,500 mg Intravenous Q12H   Continuous Infusions: . sodium chloride 100 mL/hr at 03/05/13 1516  . DOPamine  Time spent: 25 minutes    [name]  [facility] Pager [phone] If 7PM-7AM, please contact night-coverage at [email or web address], password [facility] 03/05/2013, 4:14 PM  LOS: 6 days

## 2013-03-05 NOTE — OR Nursing (Signed)
03/05/2013 @ 0920 Security took the patients wallet from the OR desk. A key to the locker they put it in is taped to the patients locker.

## 2013-03-05 NOTE — Progress Notes (Signed)
Patient reports receiving no relief from the pain medication.  MD contacted; frequency of Dilaudid changed to every 2 hours.  A one-time dose of Ativan also ordered by MD to aid in reducing patient's anxiety level.  Patient encouraged to elevate extremity, but patient refused.  Patient repositioned and emotional support provided.

## 2013-03-05 NOTE — Telephone Encounter (Addendum)
Message copied by Fredrich Birks on Fri Mar 05, 2013  1:35 PM ------      Message from: Sharee Pimple      Created: Fri Mar 05, 2013 12:27 PM      Regarding: schedule                   ----- Message -----         From: Dara Lords, PA-C         Sent: 03/05/2013  12:02 PM           To: Sharee Pimple, CMA, [facility]            S/p right fem pop bypass 03/05/13.  F/u with Dr. Edilia Bo in 2 weeks.            Thanks      Samantha ------  03/05/13: lm for pt re appt, dpm

## 2013-03-05 NOTE — Preoperative (Signed)
Beta Blockers   Reason not to administer Beta Blockers:Not Applicable 

## 2013-03-05 NOTE — Transfer of Care (Signed)
Immediate Anesthesia Transfer of Care Note  Patient: Artez Regis See  Procedure(s) Performed: Procedure(s): RIGHT FEMORAL-POPLITEAL ARTERY BYPASS GRAFT USING NON REVERSE RIGHT GREATER SAPHENOUS VEIN WITH INTRAOPERATIVE ARTERIOGRAM TIMES ONE (Right) AMPUTATION  RIGHT 5TH TOE  (Right)  Patient Location: PACU  Anesthesia Type:General  Level of Consciousness: responds to stimulation  Airway & Oxygen Therapy: Patient Spontanous Breathing and Patient connected to face mask oxygen  Post-op Assessment: Report given to PACU RN and Post -op Vital signs reviewed and stable  Post vital signs: Reviewed and stable  Complications: No apparent anesthesia complications

## 2013-03-06 LAB — BASIC METABOLIC PANEL
BUN: 7 mg/dL (ref 6–23)
GFR calc non Af Amer: >90 mL/min (ref >90)
Glucose, Bld: 94 mg/dL (ref 70–99)
Potassium: 3.6 mEq/L (ref 3.5–5.1)

## 2013-03-06 LAB — CBC
HCT: 39.5 % (ref 39.0–52.0)
Hemoglobin: 13.1 g/dL (ref 13.0–17.0)
MCH: 27.1 pg (ref 26.0–34.0)
MCHC: 33.2 g/dL (ref 30.0–36.0)
MCV: 81.8 fL (ref 78.0–100.0)
Platelets: 252 10*3/uL (ref 150–400)

## 2013-03-06 MED ORDER — LISINOPRIL-HYDROCHLOROTHIAZIDE 20-25 MG PO TABS: 1.0000 | ORAL_TABLET | Freq: Every day | ORAL | Status: DC

## 2013-03-06 MED ORDER — HYDROCHLOROTHIAZIDE 25 MG PO TABS
25.0000 mg | ORAL_TABLET | Freq: Every day | ORAL | Status: DC
  Administered 2013-03-06 – 2013-03-10 (×5): 25 mg via ORAL
  Filled 2013-03-06 (×5): qty 1

## 2013-03-06 MED ORDER — LISINOPRIL 20 MG PO TABS
20.0000 mg | ORAL_TABLET | Freq: Every day | ORAL | Status: DC
  Administered 2013-03-06 – 2013-03-10 (×5): 20 mg via ORAL
  Filled 2013-03-06 (×5): qty 1

## 2013-03-06 NOTE — Progress Notes (Signed)
Pt with temp of 101.1. Given 650mg  tylenol per order. Pt given Incentive spirometer. Instructed how to do. Pt reached 2000 on IS. Pt encouraged to do every hour while awake.  Pt demonstrated 4x to 2000 on IS.  Will continue to assess temp.

## 2013-03-06 NOTE — Progress Notes (Signed)
[facility] PROGRESS NOTE  Telesforo Brosnahan Lampron UJW:[id number] DOB: 09-Jun-1965 DOA: 02/27/2013 PCP: No PCP Per Patient  Brief narrative  48 y.o. male with a past medical history of Hypertension and active smoker. He presented with a 2 month history of foot pain starting with infection of right 5th toe that progressed due to medication noncompliance due to financial issues. He was admitted for management of foot cellulitis with concern for gangrene.   Assessment/Plan:  Peripheral Vascular disease with gangrene of right foot  . Arterial dopplers suggest severe PAD. Aortogram done by vascular surgery today which shows occluded right anterior tibial and popliteal artery. 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 Tolerated sx well.  -continue vancomycin and cefepime  -Pain control   History of HTN (hypertension)  Resume home blood pressure medications. When necessary hydralazine. Had low BP. Yesterday which has now resolved  Homelessness  As per CM he has permanent residence and [facility]. [facility] upon [facility].   Erectile Dysfucntion  Possibly due to peripheral vascular disease. Can be worked up as outpatient   Tobacco use  Counseled on smoking cessation  ordered nicotine patch   Code Status: Full Code  DVT Prophylaxis: Lovenox  Family Communication: Discussed with patient.  Disposition Plan: Home likely  in next 2 days   HPI/Subjective:  Patient seen and examined this morning. Complains of pain off and on. Stable overnight.     Objective: Filed Vitals:   03/06/13 0854  BP: 152/69  Pulse: 87  Temp: 98.3 F (36.8 C)  Resp: 19    Intake/Output Summary (Last 24 hours) at 03/06/13 1141 Last data filed at 03/06/13 0800  Gross per 24 hour  Intake 5750.01 ml  Output   4015 ml  Net 1735.01 ml   Filed Weights   02/28/13 0153  03/04/13 0104 03/05/13 1503  Weight: 113.39 kg (249 lb 15.7 oz) 110.6 kg (243 lb 13.3 oz) 119.6 kg (263 lb 10.7 oz)    Exam:  General: Middle aged obese male in no acute distress  HEENT: No pallor, muscle to mucosa  Chest: Good loss condition bilaterally, nitroglycerin sounds  CVS: Normal S1 and S2, no murmurs rub or gallop  Abdomen: Soft, nontender, nondistended, bowel sounds present  Extremities: Dressing over right foot. JP drain removed CNS: AAO x3   Data Reviewed: Basic Metabolic Panel:  Recent Labs Lab 02/27/13 2225 02/28/13 0644 03/01/13 0441 03/02/13 0452 03/03/13 1740 03/05/13 0543 03/06/13 0135  NA 139 135 134* 132*  --  141 138  K 3.7 3.5 3.7 3.5  --  3.7 3.6  CL 100 99 94* 93*  --  103 99  CO2 28 26 28 29   --  28 30  GLUCOSE 92 153* 133* 187*  --  103* 94  BUN 15 15 8 7   --  8 7  CREATININE 0.94 0.87 0.92 0.90 0.88 0.81 0.82  CALCIUM 9.8 8.8 9.1 8.8  --  8.5 8.3*  MG  --  1.9  --   --   --   --   --   PHOS  --  3.4  --   --   --   --   --    Liver Function Tests:  Recent Labs Lab 02/28/13 0644  AST 25  ALT 32  ALKPHOS 71  BILITOT 0.6  PROT 7.5  ALBUMIN 3.5   No results found  for this basename: LIPASE, AMYLASE,  in the last 168 hours No results found for this basename: AMMONIA,  in the last 168 hours CBC:  Recent Labs Lab 02/27/13 2225  03/01/13 0441 03/02/13 0452 03/03/13 1740 03/05/13 0543 03/06/13 0135  WBC 7.4  < > 6.1 5.7 6.2 5.1 9.6  NEUTROABS 3.5  --   --   --   --   --   --   HGB 15.6  < > 14.2 13.1 14.0 13.1 13.1  HCT 47.0  < > 43.6 40.7 41.8 40.2 39.5  MCV 79.9  < > 81.3 80.9 81.2 81.0 81.8  PLT 313  < > 270 241 269 266 252  < > = values in this interval not displayed. Cardiac Enzymes: No results found for this basename: CKTOTAL, CKMB, CKMBINDEX, TROPONINI,  in the last 168 hours BNP (last 3 results) No results found for this basename: PROBNP,  in the last 8760 hours CBG: No results found for this basename: GLUCAP,  in the  last 168 hours  Recent Results (from the past 240 hour(s))  SURGICAL PCR SCREEN     Status: None   Collection Time    03/03/13  7:06 AM      Result Value Range Status   MRSA, PCR NEGATIVE  NEGATIVE Final   Staphylococcus aureus NEGATIVE  NEGATIVE Final   Comment:            The Xpert SA Assay (FDA     approved for NASAL specimens     in patients over 44 years of age),     is one component of     a comprehensive surveillance     program.  Test performance has     been validated by [employer] for patients greater     than or equal to 57 year old.     It is not intended     to diagnose infection nor to     guide or monitor treatment.     Studies: Dg Ang/ext/uni/or Right  03/05/2013   CLINICAL DATA:  Right femoral to popliteal artery bypass for right 5th toe gangrene with infrainguinal peripheral vascular disease.  EXAM: RIGHT ANG/EXT/UNI/ OR  TECHNIQUE: Intraoperative image was obtained.  CONTRAST:  See operative documentation  COMPARISON:  None  FLUOROSCOPY TIME:  See operative documentation  FINDINGS: Intraoperative image obtained in a lateral projection shows a saphenous vein bypass graft with anastomosis to the below knee popliteal artery. Distal anastomosis appears normally patent. Three-vessel patent runoff is demonstrated into the distal calf. No filling defects or extravasation identified.  IMPRESSION: Imaging during bypass graft surgery shows a patent distal graft, patent distal anastomosis to the popliteal artery below the knee and patent 3 vessel runoff.   Electronically Signed   By: Irish Lack M.D.   On: 03/05/2013 12:53    Scheduled Meds: . ceFEPime (MAXIPIME) IV  2 g Intravenous Q12H  . docusate sodium  100 mg Oral BID  . enoxaparin (LOVENOX) injection  55 mg Subcutaneous Q24H  . hydrochlorothiazide  25 mg Oral Daily  . ketoconazole   Topical BID  . lisinopril  20 mg Oral Daily  . nicotine  14 mg Transdermal Daily  . pantoprazole  40 mg Oral Daily  .  vancomycin  1,500 mg Intravenous Q12H   Continuous Infusions: . DOPamine        Time spent: 25 minutes    [name]  [facility] Pager [phone] If  7PM-7AM, please contact night-coverage at [email or web address], password [facility] 03/06/2013, 11:41 AM  LOS: 7 days

## 2013-03-06 NOTE — Progress Notes (Signed)
   VASCULAR PROGRESS NOTE  SUBJECTIVE: pain adequately controlled.  PHYSICAL EXAM: Filed Vitals:   03/05/13 2200 03/05/13 2300 03/06/13 0000 03/06/13 0400  BP: 134/63 147/75 122/64 139/68  Pulse:  98  84  Temp:  99.3 F (37.4 C)  98.4 F (36.9 C)  TempSrc:  Axillary  Oral  Resp: 19 18 18 14   Height:      Weight:      SpO2:  96%  96%   Brisk posterior tibial and anterior tibial signal with Doppler.  Incisions look fine. JP drainage equals 25 cc overnight.  LABS: Lab Results  Component Value Date   WBC 9.6 03/06/2013   HGB 13.1 03/06/2013   HCT 39.5 03/06/2013   MCV 81.8 03/06/2013   PLT 252 03/06/2013   Lab Results  Component Value Date   CREATININE 0.82 03/06/2013   No results found for this basename: INR, PROTIME   CBG (last 3)  No results found for this basename: GLUCAP,  in the last 72 hours  Active Problems:   Cellulitis and abscess of foot   HTN (hypertension)   Cellulitis   PAD (peripheral artery disease)   ASSESSMENT AND PLAN:  * 1 Day Post-Op s/p: right femoral to below knee popliteal artery bypass with vein graft and ray amputation of right fifth toe.  *  Discontinue JP drain  * transfer to 2000  * physical therapy consult for partial weight-bearing right foot heel only.  * Begin dressing changes to open right fifth toe amputation with hydrogel.  Cari Caraway Beeper: [phone] 03/06/2013

## 2013-03-06 NOTE — Evaluation (Signed)
Physical Therapy Evaluation Patient Details Name: Wayne Davis MRN: [id number] DOB: 26-May-1965 Today's Date: 03/06/2013 Time: 1191-4782 PT Time Calculation (min): 18 min  PT Assessment / Plan / Recommendation History of Present Illness    Pt admit for  right femoral to below knee popliteal artery bypass with vein graft and ray amputation of right fifth toe.   Clinical Impression  Pt admitted with above. Pt currently with functional limitations due to the deficits listed below (see PT Problem List). Pt desires to go to homeless shelter for recovery.  Will need a RW and probably 3N1.  Pt will benefit from skilled PT to increase their independence and safety with mobility to allow discharge to the venue listed below.     PT Assessment  Patient needs continued PT services    Follow Up Recommendations  Home health PT;Supervision/Assistance - 24 hour        Barriers to Discharge Decreased caregiver support pt homeless    Equipment Recommendations  Rolling walker with 5" wheels;3in1 (PT)         Frequency Min 3X/week    Precautions / Restrictions Precautions Precautions: Fall Restrictions Weight Bearing Restrictions: Yes RLE Weight Bearing: Partial weight bearing RLE Partial Weight Bearing Percentage or Pounds: heel only   Pertinent Vitals/Pain VSS, 10/10 pain right LE with nursing bringing pain meds.       Mobility  Bed Mobility Bed Mobility: Rolling Right;Right Sidelying to Sit;Sitting - Scoot to [place] of Bed Rolling Right: 3: Mod assist;With rail Right Sidelying to Sit: 1: +2 Total assist;With rails;HOB elevated Right Sidelying to Sit: Patient Percentage: 40% Sitting - Scoot to Edge of Bed: 3: Mod assist Details for Bed Mobility Assistance: Pt needed cues for technique and assist for right LE due to pain.  Pt leaning posteriorly. Pt very unsafe with movements needing constant steadying assist.    Transfers Transfers: Sit to Stand;Stand to Sit;Stand Pivot  Transfers Sit to Stand: 1: +2 Total assist;With upper extremity assist;From bed Sit to Stand: Patient Percentage: 60% Stand to Sit: 1: +2 Total assist;With upper extremity assist;To chair/3-in-1;With armrests Stand to Sit: Patient Percentage: 60% Stand Pivot Transfers: 1: +2 Total assist Stand Pivot Transfers: Patient Percentage: 60% Details for Transfer Assistance: Pt needed cues and assist for hand placement.  Pt actually performed transfer quickly needing alot of steadying support.  Pt did not place any weight on right LE due to pain but held it off floor.  Pt took pivotal steps to chair with RW with poor safety overall needing steadying support.   Ambulation/Gait Ambulation/Gait Assistance: Not tested (comment) Stairs: No Wheelchair Mobility Wheelchair Mobility: No         PT Diagnosis: Generalized weakness;Acute pain  PT Problem List: Decreased activity tolerance;Decreased balance;Decreased mobility;Decreased knowledge of use of DME;Decreased safety awareness;Decreased knowledge of precautions;Pain PT Treatment Interventions: DME instruction;Gait training;Functional mobility training;Therapeutic activities;Therapeutic exercise;Balance training;Patient/family education     PT Goals(Current goals can be found in the care plan section) Acute Rehab PT Goals Patient Stated Goal: to go to a shelter for recovery PT Goal Formulation: With patient Time For Goal Achievement: 03/13/13 Potential to Achieve Goals: Good  Visit Information  Last PT Received On: 03/06/13 Assistance Needed: +2       Prior Functioning Home Living Family/patient expects to be discharged to:: Shelter/Homeless Additional Comments: Pt states shelters have been too crowded previously.   Prior Function Level of Independence: Independent Communication Communication: No difficulties    Cognition  Cognition Arousal/Alertness: Awake/alert Behavior During Therapy:  WFL for tasks assessed/performed Overall  Cognitive Status: Within Functional Limits for tasks assessed    Extremity/Trunk Assessment Upper Extremity Assessment Upper Extremity Assessment: Defer to OT evaluation Lower Extremity Assessment Lower Extremity Assessment: RLE deficits/detail RLE Deficits / Details: grossly 3-/5 RLE: Unable to fully assess due to pain Cervical / Trunk Assessment Cervical / Trunk Assessment: Normal   Balance Balance Balance Assessed: Yes Static Sitting Balance Static Sitting - Balance Support: Bilateral upper extremity supported;Feet supported Static Sitting - Level of Assistance: 4: Min assist Static Sitting - Comment/# of Minutes: 2 min with posterior lean Static Standing Balance Static Standing - Balance Support: Bilateral upper extremity supported;During functional activity Static Standing - Level of Assistance: 3: Mod assist Static Standing - Comment/# of Minutes: 1 min with RW  End of Session PT - End of Session Equipment Utilized During Treatment: Gait belt Activity Tolerance: Patient limited by fatigue Patient left: in chair;with call bell/phone within reach Nurse Communication: Mobility status;Patient requests pain meds      INGOLD,Marcelyn Ruppe 03/06/2013, 11:17 AM  [facility] [phone] (pager)

## 2013-03-06 NOTE — Progress Notes (Signed)
Transferred to 2W29 via bed with belongings, will send meds when pharmacy delivers them this am.

## 2013-03-07 ENCOUNTER — Inpatient Hospital Stay (HOSPITAL_COMMUNITY): Payer: Medicaid Other

## 2013-03-07 DIAGNOSIS — Z720 Tobacco use: Secondary | ICD-10-CM | POA: Diagnosis present

## 2013-03-07 LAB — CREATININE, SERUM
GFR calc Af Amer: >90 mL/min (ref >90)
GFR calc non Af Amer: >90 mL/min (ref >90)

## 2013-03-07 MED ORDER — KETOROLAC TROMETHAMINE 15 MG/ML IJ SOLN
15.0000 mg | Freq: Four times a day (QID) | INTRAMUSCULAR | Status: DC | PRN
  Administered 2013-03-07 – 2013-03-10 (×11): 15 mg via INTRAVENOUS
  Filled 2013-03-07 (×11): qty 1

## 2013-03-07 MED ORDER — HYDROMORPHONE HCL PF 1 MG/ML IJ SOLN
1.0000 mg | INTRAMUSCULAR | Status: DC | PRN
  Administered 2013-03-07 – 2013-03-08 (×3): 1 mg via INTRAVENOUS
  Filled 2013-03-07 (×3): qty 1

## 2013-03-07 NOTE — Progress Notes (Signed)
[facility] PROGRESS NOTE  Wayne Davis ZOX:[id number] DOB: 01/29/1966 DOA: 02/27/2013 PCP: No PCP Per Patient  Brief narrative  47 y.o. male with a past medical history of Hypertension and active smoker. He presented with a 2 month history of foot pain starting with infection of right 5th toe that progressed due to medication noncompliance due to financial issues. He was admitted for management of foot cellulitis with concern for gangrene.   Assessment/Plan:  Peripheral Vascular disease with gangrene of right foot  . Arterial dopplers suggest severe PAD. Aortogram done by vascular surgery which shows occluded right anterior tibial and popliteal artery. Patient underwent right common femoral artery to below-the-knee popliteal bypass with vein graft and ray amputation of right 5th toe.. Tolerated sx well.  -continue vancomycin and cefepime for now as patient had temp spike overnight.  -patient complaining of pain all the time. Instructed to participate with PT and mobilize. Will reduce frequency of dilaudid ( getting q 2h prn). continue Vicodin. Will add prn Toradol.   Fever on 12/28 Check UA and CXR. Blood cx sent. He does have cellulitis over right leg.   History of HTN (hypertension)  Resume home blood pressure medications. When necessary hydralazine.   ? Homelessness  As per CM he has permanent residence and uses church and shelter during winter months. [facility] upon [facility].   Erectile Dysfucntion  Possibly due to peripheral vascular disease. Can be worked up as outpatient   Tobacco use  Counseled on smoking cessation  ordered nicotine patch   Code Status: Full Code  DVT Prophylaxis: sq Lovenox  Family Communication: Discussed with patient.   DIET: regular  Antibiotics: vanco and cefepime ( 12/21>>)   Disposition Plan: Home with HHRN and PT  likely  in next 1-2 days if IV pain medications tapered and afebrile .  Continue IV abx for now given active fever. Can be discharged on a short course of po antibiotics upon discharge.   HPI/Subjective:  Patient seen and examined this morning. Complains of pain off and on. Had temp spike of 101.2 F overnight.     Objective: Filed Vitals:   03/07/13 0411  BP: 139/50  Pulse: 86  Temp: 99 F (37.2 C)  Resp: 20    Intake/Output Summary (Last 24 hours) at 03/07/13 1148 Last data filed at 03/07/13 0700  Gross per 24 hour  Intake    320 ml  Output   2825 ml  Net  -2505 ml   Filed Weights   02/28/13 0153 03/04/13 0104 03/05/13 1503  Weight: 113.39 kg (249 lb 15.7 oz) 110.6 kg (243 lb 13.3 oz) 119.6 kg (263 lb 10.7 oz)    Exam:  General: Middle aged obese male in no acute distress  HEENT: No pallor, muscle to mucosa  Chest: Good loss condition bilaterally, nitroglycerin sounds  CVS: Normal S1 and S2, no murmurs rub or gallop  Abdomen: Soft, nontender, nondistended, bowel sounds present  Extremities: Dressing over right foot.  CNS: AAO x3   Data Reviewed: Basic Metabolic Panel:  Recent Labs Lab 03/01/13 0441 03/02/13 0452 03/03/13 1740 03/05/13 0543 03/06/13 0135 03/07/13 0450  NA 134* 132*  --  141 138  --   K 3.7 3.5  --  3.7 3.6  --   CL 94* 93*  --  103 99  --   CO2 28 29  --  28 30  --   GLUCOSE 133* 187*  --  103* 94  --  BUN 8 7  --  8 7  --   CREATININE 0.92 0.90 0.88 0.81 0.82 0.78  CALCIUM 9.1 8.8  --  8.5 8.3*  --    Liver Function Tests: No results found for this basename: AST, ALT, ALKPHOS, BILITOT, PROT, ALBUMIN,  in the last 168 hours No results found for this basename: LIPASE, AMYLASE,  in the last 168 hours No results found for this basename: AMMONIA,  in the last 168 hours CBC:  Recent Labs Lab 03/01/13 0441 03/02/13 0452 03/03/13 1740 03/05/13 0543 03/06/13 0135  WBC 6.1 5.7 6.2 5.1 9.6  HGB 14.2 13.1 14.0 13.1 13.1  HCT 43.6 40.7 41.8 40.2 39.5  MCV 81.3 80.9 81.2 81.0 81.8  PLT 270 241 269 266  252   Cardiac Enzymes: No results found for this basename: CKTOTAL, CKMB, CKMBINDEX, TROPONINI,  in the last 168 hours BNP (last 3 results) No results found for this basename: PROBNP,  in the last 8760 hours CBG: No results found for this basename: GLUCAP,  in the last 168 hours  Recent Results (from the past 240 hour(s))  SURGICAL PCR SCREEN     Status: None   Collection Time    03/03/13  7:06 AM      Result Value Range Status   MRSA, PCR NEGATIVE  NEGATIVE Final   Staphylococcus aureus NEGATIVE  NEGATIVE Final   Comment:            The Xpert SA Assay (FDA     approved for NASAL specimens     in patients over 85 years of age),     is one component of     a comprehensive surveillance     program.  Test performance has     been validated by [employer] for patients greater     than or equal to 68 year old.     It is not intended     to diagnose infection nor to     guide or monitor treatment.     Studies: No results found.  Scheduled Meds: . ceFEPime (MAXIPIME) IV  2 g Intravenous Q12H  . docusate sodium  100 mg Oral BID  . enoxaparin (LOVENOX) injection  55 mg Subcutaneous Q24H  . hydrochlorothiazide  25 mg Oral Daily  . ketoconazole   Topical BID  . lisinopril  20 mg Oral Daily  . nicotine  14 mg Transdermal Daily  . pantoprazole  40 mg Oral Daily  . vancomycin  1,500 mg Intravenous Q12H   Continuous Infusions: . DOPamine        Time spent: 25 minutes    Wayne Davis  [facility] Pager [phone] If 7PM-7AM, please contact night-coverage at [email or web address], password [facility] 03/07/2013, 11:48 AM  LOS: 8 days

## 2013-03-07 NOTE — Progress Notes (Signed)
   VASCULAR PROGRESS NOTE  SUBJECTIVE: Pain adequately controlled  PHYSICAL EXAM: Filed Vitals:   03/06/13 2018 03/06/13 2215 03/07/13 0300 03/07/13 0411  BP: 153/65   139/50  Pulse: 105   86  Temp: 101.1 F (38.4 C) 100.6 F (38.1 C) 99.3 F (37.4 C) 99 F (37.2 C)  TempSrc: Oral Oral Oral Oral  Resp: 18   20  Height:      Weight:      SpO2: 96%   98%   Right foot warm. Open fifth toe amputation site clean with no significant drainage. Incisions right leg looks fine.  LABS: Lab Results  Component Value Date   WBC 9.6 03/06/2013   HGB 13.1 03/06/2013   HCT 39.5 03/06/2013   MCV 81.8 03/06/2013   PLT 252 03/06/2013   Lab Results  Component Value Date   CREATININE 0.78 03/07/2013   CBG (last 3)  No results found for this basename: GLUCAP,  in the last 72 hours  Active Problems:   Cellulitis and abscess of foot   HTN (hypertension)   Cellulitis   PAD (peripheral artery disease)   ASSESSMENT AND PLAN:  * 2 Day Post-Op s/p: Right femoral to below knee popliteal artery bypass with vein graft and ray amputation of right fifth toe.   * Physical therapy: Partial weight-bearing right foot-  heel only. I do not see that they have seen the patient yet.  * Continued dressing changes to right fifth toe amputation site with hydrogel.   * DVT prophylaxis: Lovenox  * the patient is on IV cefepime for the gangrenous wound on the right foot. Given the low-grade fever yesterday, we'll continue intravenous antibiotics for now.  * The patient is homeless. Social Work has been consulted for discharge planning.  Tretha Sciara Beeper: [phone] 03/07/2013

## 2013-03-07 NOTE — Progress Notes (Addendum)
[facility] Progress Note  03/07/2013 8:20 AM 2 Days Post-Op  Subjective:  Still with pain with ambulation;  RN states he received dilaudid every 2 hours last pm.  He states she told him that he needs to start taking pills for pain instead of IV medication.  Tm 101.1 now 99 Otherwise, VSS 98% RA  Filed Vitals:   03/07/13 0411  BP: 139/50  Pulse: 86  Temp: 99 F (37.2 C)  Resp: 20    Physical Exam: Incisions:  Below knee incision has some mild erythema; bandage of foot is c/d/i; drain site is clean and dry. Extremities:  Right foot is warm and well perfused.  + AT/PT doppler signal.  CBC    Component Value Date/Time   WBC 9.6 03/06/2013 0135   RBC 4.83 03/06/2013 0135   HGB 13.1 03/06/2013 0135   HCT 39.5 03/06/2013 0135   PLT 252 03/06/2013 0135   MCV 81.8 03/06/2013 0135   MCH 27.1 03/06/2013 0135   MCHC 33.2 03/06/2013 0135   RDW 16.1* 03/06/2013 0135   LYMPHSABS 2.9 02/27/2013 2225   MONOABS 0.6 02/27/2013 2225   EOSABS 0.3 02/27/2013 2225   BASOSABS 0.0 02/27/2013 2225    BMET    Component Value Date/Time   NA 138 03/06/2013 0135   K 3.6 03/06/2013 0135   CL 99 03/06/2013 0135   CO2 30 03/06/2013 0135   GLUCOSE 94 03/06/2013 0135   BUN 7 03/06/2013 0135   CREATININE 0.78 03/07/2013 0450   CALCIUM 8.3* 03/06/2013 0135   GFRNONAA >90 03/07/2013 0450   GFRAA >90 03/07/2013 0450    INR No results found for this basename: inr     Intake/Output Summary (Last 24 hours) at 03/07/13 0820 Last data filed at 03/07/13 0700  Gross per 24 hour  Intake    320 ml  Output   3325 ml  Net  -3005 ml     Assessment:  47 y.o. male is s/p:  1. Right femoral to below knee popliteal artery bypass with nonreversible translocated saphenous vein graft  2. Intraoperative arteriogram  3. 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  There is mild erythema around  below knee incision.  Pt is on Maxipime and Vancomycin. -continue to wean off IV pain medication -pt is homeless and wants to go to homeless shelter for recovery per PT-CM consult ordered yesterday.  Also CM for RW and 3in1. -continue increasing mobilization with PT. -DVT prophylaxis:  Lovenox   [name], PA-C [facility] [phone] 03/07/2013 8:20 AM  Agree with above. Please see my note from today.  Waverly Ferrari, MD, FACS Beeper [phone] 03/07/2013

## 2013-03-07 NOTE — Progress Notes (Signed)
Weekend CSW received patient referral for housing assistance, as patient is currently homeless. Weekend CSW met with patient and explained role, patient was agreeable to speaking. Patient states that he is homeless and that [facility] is always at capacity, he has been kicked out of the [employer], and has used up his maximum amount of days at [employer]. Patient's only source of income is his monthly disability check. Patient reports that he has no friends or family to stay with. Weekend CSW provided patient with list of local shelters, food banks, low income apartments, and information on 2-1-1. Patient states that he is not ready to leave the hospital just yet, as he feels as though he needs to stay longer for his leg to heal. Weekend CSW encouraged patient to relay his concerns to the MD, who would determine medical clearance. Patient thanked CSW for assistance. Please re-consult if further social work needs arise.   Samuella Bruin, MSW, LCSWA Clinical Social Worker [facility]. [phone]

## 2013-03-08 ENCOUNTER — Encounter (HOSPITAL_COMMUNITY): Payer: Self-pay | Admitting: Vascular Surgery

## 2013-03-08 DIAGNOSIS — F17200 Nicotine dependence, unspecified, uncomplicated: Secondary | ICD-10-CM

## 2013-03-08 DIAGNOSIS — I739 Peripheral vascular disease, unspecified: Secondary | ICD-10-CM

## 2013-03-08 MED ORDER — HYDROMORPHONE HCL PF 1 MG/ML IJ SOLN
0.5000 mg | INTRAMUSCULAR | Status: DC | PRN
  Administered 2013-03-08 – 2013-03-10 (×6): 0.5 mg via INTRAVENOUS
  Filled 2013-03-08 (×6): qty 1

## 2013-03-08 NOTE — Progress Notes (Signed)
VASCULAR LAB PRELIMINARY  ARTERIAL  ABI completed:    RIGHT    LEFT    PRESSURE WAVEFORM  PRESSURE WAVEFORM  BRACHIAL 126 triphasic BRACHIAL 136 triphasic  DP   DP    AT 116 monophasic AT 64 Severely dampened monophasic  PT 93 Severely dampened monophasic PT 71 Severely dampened monophasic  PER   PER    GREAT TOE  adequate GREAT TOE  NA    RIGHT LEFT  ABI 0.85 0.52     Deklyn Gibbon, RVT 03/08/2013, 12:25 PM

## 2013-03-08 NOTE — Progress Notes (Signed)
Rehab Admissions Coordinator Note:  Patient was screened by Clois Dupes for appropriateness for an Inpatient Acute Rehab Consult.  Noted OT recommending CIR vs SNF. Patient needs to be modified independent to return to previous living situation ( which is homeless). At this time, we are recommending Inpatient Rehab consult.  Clois Dupes 03/08/2013, 2:27 PM  I can be reached at [phone].

## 2013-03-08 NOTE — Progress Notes (Addendum)
[facility] Progress Note  03/08/2013 7:39 AM 3 Days Post-Op  Subjective:  Pain is more controlled; right foot is throbbing; "I have to go to the bathroom"  Afebrile x 24 hrs VSS  Filed Vitals:   03/08/13 0354  BP: 127/74  Pulse: 79  Temp: 98.6 F (37 C)  Resp: 19    Physical Exam: Incisions:  C/d/i-erythema improved Extremities:  Right foot is wrapped-clean, dry and intact.  CBC    Component Value Date/Time   WBC 9.6 03/06/2013 0135   RBC 4.83 03/06/2013 0135   HGB 13.1 03/06/2013 0135   HCT 39.5 03/06/2013 0135   PLT 252 03/06/2013 0135   MCV 81.8 03/06/2013 0135   MCH 27.1 03/06/2013 0135   MCHC 33.2 03/06/2013 0135   RDW 16.1* 03/06/2013 0135   LYMPHSABS 2.9 02/27/2013 2225   MONOABS 0.6 02/27/2013 2225   EOSABS 0.3 02/27/2013 2225   BASOSABS 0.0 02/27/2013 2225    BMET    Component Value Date/Time   NA 138 03/06/2013 0135   K 3.6 03/06/2013 0135   CL 99 03/06/2013 0135   CO2 30 03/06/2013 0135   GLUCOSE 94 03/06/2013 0135   BUN 7 03/06/2013 0135   CREATININE 0.78 03/07/2013 0450   CALCIUM 8.3* 03/06/2013 0135   GFRNONAA >90 03/07/2013 0450   GFRAA >90 03/07/2013 0450    INR No results found for this basename: inr     Intake/Output Summary (Last 24 hours) at 03/08/13 0739 Last data filed at 03/08/13 0500  Gross per 24 hour  Intake    120 ml  Output   2500 ml  Net  -2380 ml     Assessment:  47 y.o. male is s/p:  1. Right femoral to below knee popliteal artery bypass with nonreversible translocated saphenous vein graft  2. Intraoperative arteriogram  3. Ray amputation of the right fifth toe   3 Days Post-Op  Plan: -pt doing well this am -needs to mobilize more -dressing on foot not taken down as pt wants to use the bathroom -will need to keep right groin dry with gauze daily and change as needed. -CSW provided pt with list of homeless shelters, etc for discharge as he is homeless -DVT prophylaxis:   Lovenox   [name], PA-C [facility] [phone] 03/08/2013 7:39 AM    Agree with the above.  D/c situation challenging given homeless.  Probably will need an extra 1-2 days in hospital.  Appreciate SW assistance in this matter  [employer]

## 2013-03-08 NOTE — Evaluation (Signed)
Occupational Therapy Evaluation Patient Details Name: Wayne Davis MRN: [id number] DOB: [birth year] Today's Date: 03/08/2013 Time: 4098-1191 OT Time Calculation (min): 18 min  OT Assessment / Plan / Recommendation History of present illness Pt admit for right femoral to below knee popliteal artery bypass with vein graft and ray amputation of right fifth toe   Clinical Impression   Patient is s/p above surgery resulting in functional limitations due to the deficits listed below (see OT Problem List).  Patient will benefit from skilled OT to increase their safety and independence with ADL and functional mobility for ADL to allow facilitate discharge to venue listed below.     OT Assessment  Patient needs continued OT Services    Follow Up Recommendations  CIR vs. SNF;Supervision/Assistance - 24 hour- Intermittent    Barriers to Discharge Other (comment) (homeless) Reports that when his monthly check comes in and he has the money, he stays in a motel when the shelters are over-crowded  Equipment Recommendations   (would benefit from 3 in 1 however unsure how he would manage it-homeless)    Recommendations for Other Services Rehab consult  Frequency  Min 2X/week    Precautions / Restrictions Precautions Precautions: Fall Restrictions Weight Bearing Restrictions: Yes RLE Weight Bearing: Partial weight bearing RLE Partial Weight Bearing Percentage or Pounds: heel only   Pertinent Vitals/Pain Reports 9.5/10 RLE pain    ADL  Grooming: Simulated;Set up Where Assessed - Grooming: Supported sitting Upper Body Bathing: Simulated;Set up Where Assessed - Upper Body Bathing: Supported sitting Lower Body Bathing: Simulated;Maximal assistance;+1 Total assistance Where Assessed - Lower Body Bathing: Supported sitting;Lean right and/or left Upper Body Dressing: Simulated;Set up Where Assessed - Upper Body Dressing: Supported sitting Lower Body Dressing: Simulated;Performed;+1  Total assistance Where Assessed - Lower Body Dressing: Lean right and/or left;Supported sitting Transfers/Ambulation Related to ADLs: Patient declined to stand due to pain.  Patient transitioned from recline to sit up with feet on floor then asked to recline again at end of session due to pain. ADL Comments: Patient able to doff his left sock yet unable to donn it.  Right foot is heavily bandaged and Darco boot not yet in room.    OT Diagnosis: Generalized weakness;Acute pain  OT Problem List: Decreased range of motion;Decreased strength;Decreased activity tolerance;Impaired balance (sitting and/or standing);Decreased safety awareness;Decreased knowledge of use of DME or AE;Decreased knowledge of precautions;Pain OT Treatment Interventions: Self-care/ADL training;DME and/or AE instruction;Therapeutic activities;Patient/family education;Balance training   OT Goals(Current goals can be found in the care plan section) Acute Rehab OT Goals Patient Stated Goal: Don't know where I will go from here-shelter or motel till money runs out. OT Goal Formulation: With patient Time For Goal Achievement: 03/22/13 Potential to Achieve Goals: Good  Visit Information  Last OT Received On: 03/08/13 Assistance Needed: +2 (for safety with mobility) History of Present Illness: Pt admit for right femoral to below knee popliteal artery bypass with vein graft and ray amputation of right fifth toe       Prior Functioning     Home Living Family/patient expects to be discharged to:: Shelter/Homeless Additional Comments: Pt states shelters have been too crowded previously and he will occasionally stay in a motel when his monthly check comes in. Prior Function Level of Independence: Independent Communication Communication: No difficulties Dominant Hand: Right    Cognition  Cognition Arousal/Alertness: Awake/alert Behavior During Therapy: WFL for tasks assessed/performed Overall Cognitive Status: Within  Functional Limits for tasks assessed    Extremity/Trunk Assessment Upper Extremity  Assessment Upper Extremity Assessment: Overall WFL for tasks assessed Lower Extremity Assessment Lower Extremity Assessment: Defer to PT evaluation     Mobility Transfers Details for Transfer Assistance: Patient declined transfer to bed or BSC and also declined to stand with OT during eval due to 9.5/10 RLE pain. Per PT patient is +2 and unsafe with transfers    End of Session OT - End of Session Activity Tolerance: Patient limited by pain Patient left: in chair;with call bell/phone within reach  GO     Wayne Davis 03/08/2013, 11:59 AM

## 2013-03-08 NOTE — Progress Notes (Addendum)
[facility] PROGRESS NOTE  Wayne Davis ZOX:[id number] DOB: [birth year] DOA: 02/27/2013 PCP: No PCP Per Patient  Brief narrative  47 y.o. male with a past medical history of Hypertension and active smoker. He presented with a 2 month history of foot pain starting with infection of right 5th toe that progressed due to medication noncompliance due to financial issues. He was admitted for management of foot cellulitis with concern for gangrene.   Assessment/Plan:  Peripheral Vascular disease with gangrene of right foot  - Arterial dopplers suggest severe PAD. Aortogram done by vascular surgery which shows occluded right anterior tibial and popliteal artery. Patient underwent right common femoral artery to below-the-knee popliteal bypass with vein graft and ray amputation of right 5th toe.. Tolerated sx well.  -continue vancomycin and cefepime for now per vascular -patient complaining of pain all the time. Instructed to participate with PT and mobilize. Will reduce frequency/dose of dilaudid: continue Vicodin. Will add prn Toradol.   Fever on 12/28  CXR negative. Blood cx sent- NGTD. He does have cellulitis over right leg.   History of HTN (hypertension)  Resume home blood pressure medications. When necessary hydralazine.   ? Homelessness  As per CM he has permanent residence and uses church and shelter during winter months. [facility]- patient states he MAY call the resources left by the weekend social worker today- encouraged him to do so as he is nearing d/c.  Erectile Dysfucntion  Possibly due to peripheral vascular disease. Can be worked up as outpatient   Tobacco use  Counseled on smoking cessation  ordered nicotine patch   Code Status: Full Code  DVT Prophylaxis: sq Lovenox  Family Communication: Discussed with patient.   DIET: regular  Antibiotics: vanco and cefepime ( 12/21>>)   Disposition Plan: Out with  [facility] and PT  likely  in next 1-2 days if IV pain medications tapered and afebrile . Continue IV abx for now given active fever. Can be discharged on a short course of po antibiotics upon discharge.   HPI/Subjective:  No new c/o today, no fever, no chills, no SOB     Objective: Filed Vitals:   03/08/13 0354  BP: 127/74  Pulse: 79  Temp: 98.6 F (37 C)  Resp: 19    Intake/Output Summary (Last 24 hours) at 03/08/13 0748 Last data filed at 03/08/13 0500  Gross per 24 hour  Intake    120 ml  Output   2500 ml  Net  -2380 ml   Filed Weights   02/28/13 0153 03/04/13 0104 03/05/13 1503  Weight: 113.39 kg (249 lb 15.7 oz) 110.6 kg (243 lb 13.3 oz) 119.6 kg (263 lb 10.7 oz)    Exam:  General: Middle aged obese male in no acute distress  HEENT: No pallor, muscle to mucosa  Chest: clear CVS: Normal S1 and S2, no murmurs rub or gallop  Abdomen: Soft, nontender, nondistended, bowel sounds present  Extremities: Dressing over right foot.  CNS: AAO x3   Data Reviewed: Basic Metabolic Panel:  Recent Labs Lab 03/02/13 0452 03/03/13 1740 03/05/13 0543 03/06/13 0135 03/07/13 0450  NA 132*  --  141 138  --   K 3.5  --  3.7 3.6  --   CL 93*  --  103 99  --   CO2 29  --  28 30  --   GLUCOSE 187*  --  103* 94  --   BUN 7  --  8  7  --   CREATININE 0.90 0.88 0.81 0.82 0.78  CALCIUM 8.8  --  8.5 8.3*  --    Liver Function Tests: No results found for this basename: AST, ALT, ALKPHOS, BILITOT, PROT, ALBUMIN,  in the last 168 hours No results found for this basename: LIPASE, AMYLASE,  in the last 168 hours No results found for this basename: AMMONIA,  in the last 168 hours CBC:  Recent Labs Lab 03/02/13 0452 03/03/13 1740 03/05/13 0543 03/06/13 0135  WBC 5.7 6.2 5.1 9.6  HGB 13.1 14.0 13.1 13.1  HCT 40.7 41.8 40.2 39.5  MCV 80.9 81.2 81.0 81.8  PLT 241 269 266 252   Cardiac Enzymes: No results found for this basename: CKTOTAL, CKMB, CKMBINDEX, TROPONINI,  in the last  168 hours BNP (last 3 results) No results found for this basename: PROBNP,  in the last 8760 hours CBG: No results found for this basename: GLUCAP,  in the last 168 hours  Recent Results (from the past 240 hour(s))  SURGICAL PCR SCREEN     Status: None   Collection Time    03/03/13  7:06 AM      Result Value Range Status   MRSA, PCR NEGATIVE  NEGATIVE Final   Staphylococcus aureus NEGATIVE  NEGATIVE Final   Comment:            The Xpert SA Assay (FDA     approved for NASAL specimens     in patients over 9 years of age),     is one component of     a comprehensive surveillance     program.  Test performance has     been validated by [employer] for patients greater     than or equal to 64 year old.     It is not intended     to diagnose infection nor to     guide or monitor treatment.     Studies: Dg Chest Port 1 View  03/07/2013   CLINICAL DATA:  Fever  EXAM: PORTABLE CHEST - 1 VIEW  COMPARISON:  March 04, 2013  FINDINGS: Lungs are clear. Heart size and pulmonary vascularity are normal. No adenopathy. Patient is status post median sternotomy. No bone lesions. No pneumothorax.  IMPRESSION: No edema or consolidation.   Electronically Signed   By: Bretta Bang M.D.   On: 03/07/2013 17:06    Scheduled Meds: . ceFEPime (MAXIPIME) IV  2 g Intravenous Q12H  . docusate sodium  100 mg Oral BID  . enoxaparin (LOVENOX) injection  55 mg Subcutaneous Q24H  . hydrochlorothiazide  25 mg Oral Daily  . ketoconazole   Topical BID  . lisinopril  20 mg Oral Daily  . nicotine  14 mg Transdermal Daily  . pantoprazole  40 mg Oral Daily  . vancomycin  1,500 mg Intravenous Q12H   Continuous Infusions: . DOPamine        Time spent: 25 minutes    Marlin Canary  [facility] Pager [phone] If 7PM-7AM, please contact night-coverage at [email or web address], password [facility] 03/08/2013, 7:48 AM  LOS: 9 days

## 2013-03-08 NOTE — Progress Notes (Signed)
Physical Therapy Treatment Patient Details Name: Wayne Davis MRN: [id number] DOB: 05-09-1965 Today's Date: 03/08/2013 Time: 1478-2956 PT Time Calculation (min): 31 min  PT Assessment / Plan / Recommendation  History of Present Illness Pt admit for right femoral to below knee popliteal artery bypass with vein graft and ray amputation of right fifth toe   PT Comments   Pt cont's to be limited by pain in Rt foot/LE but he was able to ambulate ~5' with RW & tolerated some WBing through Rt heel.     Follow Up Recommendations  Home health PT;Supervision/Assistance - 24 hour     Does the patient have the potential to tolerate intense rehabilitation     Barriers to Discharge        Equipment Recommendations  Rolling walker with 5" wheels;3in1 (PT)    Recommendations for Other Services    Frequency Min 3X/week   Progress towards PT Goals Progress towards PT goals: Progressing toward goals  Plan Current plan remains appropriate    Precautions / Restrictions Precautions Precautions: Fall Restrictions Weight Bearing Restrictions: Yes RLE Weight Bearing: Partial weight bearing RLE Partial Weight Bearing Percentage or Pounds: heel only   Pertinent Vitals/Pain C/o Rt foot/LE pain but did not rate.  Repositioned for comfort.     Mobility  Bed Mobility Bed Mobility: Supine to Sit;Sitting - Scoot to Edge of Bed Supine to Sit: 5: Supervision;HOB flat Sitting - Scoot to Edge of Bed: 5: Supervision Details for Bed Mobility Assistance: Incr time & slightly effortful but no physical (A) needed.   Transfers Transfers: Sit to Stand;Stand to Sit Sit to Stand: 4: Min assist;With upper extremity assist;From bed Stand to Sit: 4: Min assist;Not tested (comment);With armrests;To chair/3-in-1 Details for Transfer Assistance: cues for safest hand placement & technique.   Ambulation/Gait Ambulation/Gait Assistance: 4: Min assist (+2 to follow with recliner) Ambulation Distance (Feet):  5 Feet Assistive device: Rolling walker Ambulation/Gait Assistance Details: cues for sequencing, tall posture, use of UE's to offload weight from RLE while advancing Lt foot.  Pt cont's to be limited by pain but did tolerate some WBing through Rt heel.   Gait Pattern: Step-to pattern Gait velocity: decreased Stairs: No Wheelchair Mobility Wheelchair Mobility: No      PT Goals (current goals can now be found in the care plan section) Acute Rehab PT Goals Patient Stated Goal: Don't know where I will go from here-shelter or motel till money runs out. PT Goal Formulation: With patient Time For Goal Achievement: 03/13/13 Potential to Achieve Goals: Good  Visit Information  Last PT Received On: 03/08/13 Assistance Needed: +2 (safety with ambulation) History of Present Illness: Pt admit for right femoral to below knee popliteal artery bypass with vein graft and ray amputation of right fifth toe    Subjective Data  Patient Stated Goal: Don't know where I will go from here-shelter or motel till money runs out.   Cognition  Cognition Arousal/Alertness: Awake/alert Behavior During Therapy: Flat affect Overall Cognitive Status: Within Functional Limits for tasks assessed    Balance     End of Session PT - End of Session Equipment Utilized During Treatment: Gait belt Activity Tolerance: Patient tolerated treatment well;Patient limited by pain Patient left: in chair;with call bell/phone within reach Nurse Communication: Mobility status   GP     Lara Mulch 03/08/2013, 1:00 PM   Verdell Face, PTA [phone] 03/08/2013

## 2013-03-08 NOTE — Progress Notes (Signed)
CSW attempted to speak to patient today about possible SNF placement. Patient was asleep, will try again later.  Maree Krabbe, MSW, [facility] [phone]

## 2013-03-08 NOTE — Progress Notes (Signed)
ANTIBIOTIC CONSULT NOTE - FOLLOW UP  Pharmacy Consult for Vancomycin + Cefepime Indication: RLE cellulitis  No Known Allergies  Patient Measurements: Height: 6\' 1"  (185.4 cm) Weight: 263 lb 10.7 oz (119.6 kg) IBW/kg (Calculated) : 79.9  Vital Signs: Temp: 98.6 F (37 C) (12/29 0354) Temp src: Oral (12/29 0354) BP: 127/74 mmHg (12/29 0354) Pulse Rate: 79 (12/29 0354) Intake/Output from previous day: 12/28 0701 - 12/29 0700 In: 120 [P.O.:120] Out: 2500 [Urine:2500] Intake/Output from this shift: Total I/O In: 240 [P.O.:240] Out: 450 [Urine:450]  Labs:  Recent Labs  03/06/13 0135 03/07/13 0450  WBC 9.6  --   HGB 13.1  --   PLT 252  --   CREATININE 0.82 0.78   Estimated Creatinine Clearance: 151.4 ml/min (by C-G formula based on Cr of 0.78). No results found for this basename: VANCOTROUGH, Leodis Binet, VANCORANDOM, GENTTROUGH, GENTPEAK, GENTRANDOM, TOBRATROUGH, TOBRAPEAK, TOBRARND, AMIKACINPEAK, AMIKACINTROU, AMIKACIN,  in the last 72 hours   Microbiology: Recent Results (from the past 720 hour(s))  SURGICAL PCR SCREEN     Status: None   Collection Time    03/03/13  7:06 AM      Result Value Range Status   MRSA, PCR NEGATIVE  NEGATIVE Final   Staphylococcus aureus NEGATIVE  NEGATIVE Final   Comment:            The Xpert SA Assay (FDA     approved for NASAL specimens     in patients over 46 years of age),     is one component of     a comprehensive surveillance     program.  Test performance has     been validated by [employer] for patients greater     than or equal to 72 year old.     It is not intended     to diagnose infection nor to     guide or monitor treatment.    Anti-infectives   Start     Dose/Rate Route Frequency Ordered Stop   03/06/13 0000  vancomycin (VANCOCIN) 1,500 mg in sodium chloride 0.9 % 500 mL IVPB     1,500 mg 250 mL/hr over 120 Minutes Intravenous Every 12 hours 03/05/13 2116     03/05/13 2200  ceFEPIme (MAXIPIME) 2 g in  dextrose 5 % 50 mL IVPB     2 g 100 mL/hr over 30 Minutes Intravenous Every 12 hours 03/05/13 2117     03/03/13 0000  [MAR Hold]  vancomycin (VANCOCIN) 1,500 mg in sodium chloride 0.9 % 500 mL IVPB  Status:  Discontinued     (On MAR Hold since 03/05/13 0641)   1,500 mg 250 mL/hr over 120 Minutes Intravenous Every 12 hours 03/02/13 1654 03/05/13 2124   03/02/13 2000  [MAR Hold]  ceFEPIme (MAXIPIME) 2 g in dextrose 5 % 50 mL IVPB  Status:  Discontinued     (On MAR Hold since 03/05/13 0641)   2 g 100 mL/hr over 30 Minutes Intravenous Every 12 hours 03/02/13 1710 03/05/13 2124   02/28/13 1500  vancomycin (VANCOCIN) IVPB 1000 mg/200 mL premix  Status:  Discontinued     1,000 mg 200 mL/hr over 60 Minutes Intravenous Every 12 hours 02/28/13 0252 03/02/13 1654   02/28/13 1000  levofloxacin (LEVAQUIN) IVPB 500 mg  Status:  Discontinued     500 mg 100 mL/hr over 60 Minutes Intravenous Every 24 hours 02/28/13 0914 03/02/13 2009   02/28/13 0300  vancomycin (VANCOCIN) 2,000 mg in sodium  chloride 0.9 % 500 mL IVPB     2,000 mg 250 mL/hr over 120 Minutes Intravenous  Once 02/28/13 0252 02/28/13 0536   02/27/13 2200  clindamycin (CLEOCIN) IVPB 600 mg     600 mg 100 mL/hr over 30 Minutes Intravenous  Once 02/27/13 2144 02/27/13 2318      Assessment: 47 y.o. M who continues on Vancomycin + Cefepime for empiric coverage of RLE cellulitis. The patient is s/p R fem-pop BPG, arteriogram, and R 5th toe amputation on 12/26. Noted fever spike on 12/28, now back down to wnl. Renal function remains stable -- last Vanc trough on 12/25 was within range. Doses remain appropriate for now.   Goal of Therapy:  Vancomycin trough level 10-15 mcg/ml Proper antibiotics for infection/cultures adjusted for renal/hepatic function   Plan:  1. Continue Vancomycin 1500 mg IV every 12 hours 2. Continue Cefepime 2g IV every 12 hours 3. Will continue to follow renal function, culture results, LOT, and antibiotic de-escalation  plans   Georgina Pillion, PharmD, BCPS Clinical Pharmacist Pager: [phone] 03/08/2013 2:01 PM

## 2013-03-08 NOTE — Progress Notes (Signed)
Pt called out with c/o his IV leaking.  IV team was paged since I was unable to find good access point for an IV.

## 2013-03-09 DIAGNOSIS — S98919A Complete traumatic amputation of unspecified foot, level unspecified, initial encounter: Secondary | ICD-10-CM

## 2013-03-09 DIAGNOSIS — I739 Peripheral vascular disease, unspecified: Secondary | ICD-10-CM

## 2013-03-09 DIAGNOSIS — L98499 Non-pressure chronic ulcer of skin of other sites with unspecified severity: Secondary | ICD-10-CM

## 2013-03-09 NOTE — Progress Notes (Addendum)
[facility] Progress Note  03/09/2013 10:51 AM 4 Days Post-Op  Subjective:  Sleeping-awakes easily;   Tm 99.2 now afebrile VSS  Filed Vitals:   03/09/13 0917  BP: 134/82  Pulse: 76  Temp:   Resp:     Physical Exam: Incisions:  All incisions are healing nicely. Extremities:  Right foot bandage removed. 1cm wound at area of toe amputation site.  CBC    Component Value Date/Time   WBC 9.6 03/06/2013 0135   RBC 4.83 03/06/2013 0135   HGB 13.1 03/06/2013 0135   HCT 39.5 03/06/2013 0135   PLT 252 03/06/2013 0135   MCV 81.8 03/06/2013 0135   MCH 27.1 03/06/2013 0135   MCHC 33.2 03/06/2013 0135   RDW 16.1* 03/06/2013 0135   LYMPHSABS 2.9 02/27/2013 2225   MONOABS 0.6 02/27/2013 2225   EOSABS 0.3 02/27/2013 2225   BASOSABS 0.0 02/27/2013 2225    BMET    Component Value Date/Time   NA 138 03/06/2013 0135   K 3.6 03/06/2013 0135   CL 99 03/06/2013 0135   CO2 30 03/06/2013 0135   GLUCOSE 94 03/06/2013 0135   BUN 7 03/06/2013 0135   CREATININE 0.78 03/07/2013 0450   CALCIUM 8.3* 03/06/2013 0135   GFRNONAA >90 03/07/2013 0450   GFRAA >90 03/07/2013 0450    INR No results found for this basename: inr     Intake/Output Summary (Last 24 hours) at 03/09/13 1051 Last data filed at 03/09/13 0544  Gross per 24 hour  Intake    240 ml  Output   1000 ml  Net   -760 ml   ABI's 03/08/13:  RIGHT    LEFT     PRESSURE  WAVEFORM   PRESSURE  WAVEFORM   BRACHIAL  126  triphasic  BRACHIAL  136  triphasic   DP    DP     AT  116  monophasic  AT  64  Severely dampened monophasic   PT  93  Severely dampened monophasic  PT  71  Severely dampened monophasic   PER    PER     GREAT TOE   adequate  GREAT TOE   NA     RIGHT  LEFT   ABI  0.85  0.52      Assessment:  47 y.o. male is s/p:  1. Right femoral to below knee popliteal artery bypass with nonreversible translocated saphenous vein graft  2. Intraoperative arteriogram  3. Ray amputation of the right  fifth toe  4 Days Post-Op  Plan: -Pt doing well -continue to mobilize -redress foot wound with hydrogel and wrap (pt wanted to go to the bathroom therefore nurse will dress foot) - possible disposition to CIR as he is appropriate candidate-will await follow up recommendations. -DVT prophylaxis:  Lovenox -right and left ABI's are improved as pre op were 0.3 bilaterally.  [name], PA-C [facility] [phone] 03/09/2013 10:51 AM   Spoke with Dr. Myra Gianotti about antibiotics for this pt.  He suggests IV ABx for 2 more weeks.
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
## 2013-03-09 NOTE — Progress Notes (Signed)
Physical Therapy Treatment Patient Details Name: Wayne Davis MRN: [id number] DOB: 06-23-1965 Today's Date: 03/09/2013 Time: 4098-1191 PT Time Calculation (min): 24 min  PT Assessment / Plan / Recommendation  History of Present Illness Pt admit for right femoral to below knee popliteal artery bypass with vein graft and ray amputation of right fifth toe   PT Comments   Limited by pain but increased ambulation distance today.   Follow Up Recommendations  SNF vs. CIR;Supervision/Assistance - 24 hour     Does the patient have the potential to tolerate intense rehabilitation   potentially     Equipment Recommendations  Rolling walker with 5" wheels;3in1 (PT)       Frequency Min 3X/week   Progress towards PT Goals Progress towards PT goals: Progressing toward goals  Plan Current plan remains appropriate    Precautions / Restrictions Precautions Precautions: Fall Restrictions RLE Weight Bearing: Partial weight bearing RLE Partial Weight Bearing Percentage or Pounds: heel only   Pertinent Vitals/Pain 9.5/10 Rt. Foot and surgical area of leg. Premedicated    Mobility  Bed Mobility Bed Mobility: Supine to Sit;Sitting - Scoot to Edge of Bed Supine to Sit: 5: Supervision;HOB flat Sitting - Scoot to Edge of Bed: 5: Supervision Details for Bed Mobility Assistance: Incr time & slightly effortful but no physical (A) needed.   Transfers Transfers: Sit to Stand;Stand to Sit Sit to Stand: 4: Min assist;With upper extremity assist;From bed Stand to Sit: 4: Min assist;With armrests;To chair/3-in-1;3: Mod assist Details for Transfer Assistance: cues for safest hand placement & technique.  Movement is very painful for pt Ambulation/Gait Ambulation/Gait Assistance: 4: Min assist;3: Mod assist (+2 to follow with recliner) Ambulation Distance (Feet): 10 Feet Assistive device: Rolling walker Ambulation/Gait Assistance Details: Cues for sequencing - tends to get too close to front  of RW. Cues for minimizing weight through Rt. LE for pain modulaion and to maintain precautions. Encouragement to reach goal distance.  Gait Pattern: Step-to pattern Gait velocity: decreased Stairs: No Wheelchair Mobility Wheelchair Mobility: No     PT Goals (current goals can now be found in the care plan section) Acute Rehab PT Goals Patient Stated Goal: "I will try to get up." PT Goal Formulation: With patient Time For Goal Achievement: 03/13/13 Potential to Achieve Goals: Good  Visit Information  Assistance Needed: +2 (chair) History of Present Illness: Pt admit for right femoral to below knee popliteal artery bypass with vein graft and ray amputation of right fifth toe    Subjective Data  Patient Stated Goal: "I will try to get up."   Cognition  Cognition Arousal/Alertness: Awake/alert Behavior During Therapy: Flat affect Overall Cognitive Status: Within Functional Limits for tasks assessed    Balance  Balance Balance Assessed: Yes Static Sitting Balance Static Sitting - Balance Support: Bilateral upper extremity supported;Feet supported Static Sitting - Level of Assistance: 5: Stand by assistance Static Standing Balance Static Standing - Balance Support: Bilateral upper extremity supported;During functional activity Static Standing - Level of Assistance: 3: Mod assist Static Standing - Comment/# of Minutes: mod assist progressing to min assist  End of Session PT - End of Session Equipment Utilized During Treatment: Gait belt Activity Tolerance: Patient tolerated treatment well;Patient limited by pain Patient left: in chair;with call bell/phone within reach Nurse Communication: Mobility status   GP     [name] 03/09/2013, 1:53 PM

## 2013-03-09 NOTE — Progress Notes (Deleted)
Clinical Social Worker received referral from MD for CIR vs. SNF placement at d/c. CSW introduced self and explained reason for visit. CSW explained SNF process to patient and explained that there will be limited options because of insurance. Patient reported he is agreeable for CIR or SNF placement. CSW will complete FL2 for MD's signature and will update patient when bed offers are received.  Maree Krabbe, MSW, [facility] [phone]

## 2013-03-09 NOTE — Progress Notes (Signed)
Patient alert and oriented x4.  Patient consistently scoring pain high on 0-10 scale, but after explanation about trying to wean down on IV pain medication so that he can move to a rehab facility, patient agreed to try limiting his use of IV PRNs.  Otherwise, PRN vicodin and ketorolac administered throughout shift.  Patient sleeping at this time.  Will continue to monitor.

## 2013-03-09 NOTE — Progress Notes (Signed)
Clinical Social Worker received referral from MD for CIR vs. SNF placement at d/c. CSW introduced self and explained reason for visit. CSW explained SNF process to patient and explained that there will be limited options because of insurance. Patient reported he is agreeable for CIR or SNF placement. CSW will complete FL2 for MD's signature and will update patient when bed offers are received.  Hermela Hardt, MSW, LCSWA [phone]  

## 2013-03-09 NOTE — Progress Notes (Signed)
Clinical Social Work Department CLINICAL SOCIAL WORK PLACEMENT NOTE 03/09/2013  Patient:  DAISEAN, BRODHEAD  Account Number:  [id number] Admit date:  02/27/2013  Clinical Social Worker:  Carren Rang  Date/time:  03/09/2013 03:15 PM  Clinical Social Work is seeking post-discharge placement for this patient at the following level of care:   SKILLED NURSING   (*CSW will update this form in Epic as items are completed)   03/09/2013  Patient/family provided with [facility] Department of Clinical Social Work's list of facilities offering this level of care within the geographic area requested by the patient (or if unable, by the patient's family).  03/09/2013  Patient/family informed of their freedom to choose among providers that offer the needed level of care, that participate in Medicare, Medicaid or managed care program needed by the patient, have an available bed and are willing to accept the patient.  03/09/2013  Patient/family informed of [facility]' ownership interest in [facility], as well as of the fact that they are under no obligation to receive care at this facility.  PASARR submitted to EDS on  PASARR number received from EDS on   FL2 transmitted to all facilities in geographic area requested by pt/family on  03/09/2013 FL2 transmitted to all facilities within larger geographic area on   Patient informed that his/her managed care company has contracts with or will negotiate with  certain facilities, including the following:     Patient/family informed of bed offers received:   Patient chooses bed at  Physician recommends and patient chooses bed at    Patient to be transferred to  on   Patient to be transferred to facility by   The following physician request were entered in Epic:   Additional Comments:  Maree Krabbe, MSW, [employer] [phone]

## 2013-03-09 NOTE — Progress Notes (Addendum)
[facility] PROGRESS NOTE  Wayne Davis UJW:[id number] DOB: [birth year] DOA: 02/27/2013 PCP: No PCP Per Patient  Brief narrative  47 y.o. male with a past medical history of Hypertension and active smoker. He presented with a 2 month history of foot pain starting with infection of right 5th toe that progressed due to medication noncompliance due to financial issues. He was admitted for management of foot cellulitis with concern for gangrene.   Assessment/Plan:  Peripheral Vascular disease with gangrene of right foot  - Arterial dopplers suggest severe PAD. Aortogram done by vascular surgery which shows occluded right anterior tibial and popliteal artery. Patient underwent right common femoral artery to below-the-knee popliteal bypass with vein graft and ray amputation of right 5th toe.. Tolerated sx well.  -continue vancomycin and cefepime for now per vascular- defer oral abx to them -patient complaining of pain all the time. Instructed to participate with PT and mobilize. Will reduce frequency/dose of dilaudid: continue Vicodin. Will add prn Toradol.   Fever on 12/28  CXR negative. Blood cx sent- NGTD. He does have cellulitis over right leg.   History of HTN (hypertension)  Resume home blood pressure medications. When necessary hydralazine.   ? Homelessness  As per CM he has permanent residence and uses church and shelter during winter months. [facility]- patient states he DID NOT CALL YESTERDAY but MAY call the resources left by the weekend social worker today- encouraged him to do so as he is nearing d/c for a back up plan if CIR/SNF does not happen  Erectile Dysfucntion  Possibly due to peripheral vascular disease. Can be worked up as outpatient   Tobacco use  Counseled on smoking cessation  ordered nicotine patch   Code Status: Full Code  DVT Prophylaxis: sq Lovenox  Family Communication: Discussed with patient.    DIET: regular  Antibiotics: vanco and cefepime ( 12/21>>)   Disposition Plan: CIR? Vs SNF- will need IV abx for 2 weeks- Child psychotherapist consulted and contacted   HPI/Subjective:  No new c/o, no SOB, no CP     Objective: Filed Vitals:   03/09/13 0543  BP: 138/76  Pulse: 73  Temp: 98.2 F (36.8 C)  Resp: 20    Intake/Output Summary (Last 24 hours) at 03/09/13 0731 Last data filed at 03/09/13 0544  Gross per 24 hour  Intake    480 ml  Output   1450 ml  Net   -970 ml   Filed Weights   02/28/13 0153 03/04/13 0104 03/05/13 1503  Weight: 113.39 kg (249 lb 15.7 oz) 110.6 kg (243 lb 13.3 oz) 119.6 kg (263 lb 10.7 oz)    Exam:  General: Middle aged obese male in no acute distress  HEENT: No pallor, muscle to mucosa  Chest: clear b/l, no wheezing CVS: Normal S1 and S2, no murmurs rub or gallop  Abdomen: Soft, nontender, nondistended, bowel sounds present  Extremities: Dressing over right foot.  CNS: AAO x3   Data Reviewed: Basic Metabolic Panel:  Recent Labs Lab 03/03/13 1740 03/05/13 0543 03/06/13 0135 03/07/13 0450  NA  --  141 138  --   K  --  3.7 3.6  --   CL  --  103 99  --   CO2  --  28 30  --   GLUCOSE  --  103* 94  --   BUN  --  8 7  --   CREATININE 0.88 0.81 0.82 0.78  CALCIUM  --  8.5 8.3*  --    Liver Function Tests: No results found for this basename: AST, ALT, ALKPHOS, BILITOT, PROT, ALBUMIN,  in the last 168 hours No results found for this basename: LIPASE, AMYLASE,  in the last 168 hours No results found for this basename: AMMONIA,  in the last 168 hours CBC:  Recent Labs Lab 03/03/13 1740 03/05/13 0543 03/06/13 0135  WBC 6.2 5.1 9.6  HGB 14.0 13.1 13.1  HCT 41.8 40.2 39.5  MCV 81.2 81.0 81.8  PLT 269 266 252   Cardiac Enzymes: No results found for this basename: CKTOTAL, CKMB, CKMBINDEX, TROPONINI,  in the last 168 hours BNP (last 3 results) No results found for this basename: PROBNP,  in the last 8760 hours CBG: No  results found for this basename: GLUCAP,  in the last 168 hours  Recent Results (from the past 240 hour(s))  SURGICAL PCR SCREEN     Status: None   Collection Time    03/03/13  7:06 AM      Result Value Range Status   MRSA, PCR NEGATIVE  NEGATIVE Final   Staphylococcus aureus NEGATIVE  NEGATIVE Final   Comment:            The Xpert SA Assay (FDA     approved for NASAL specimens     in patients over 74 years of age),     is one component of     a comprehensive surveillance     program.  Test performance has     been validated by [employer] for patients greater     than or equal to 6 year old.     It is not intended     to diagnose infection nor to     guide or monitor treatment.     Studies: Dg Chest Port 1 View  03/07/2013   CLINICAL DATA:  Fever  EXAM: PORTABLE CHEST - 1 VIEW  COMPARISON:  March 04, 2013  FINDINGS: Lungs are clear. Heart size and pulmonary vascularity are normal. No adenopathy. Patient is status post median sternotomy. No bone lesions. No pneumothorax.  IMPRESSION: No edema or consolidation.   Electronically Signed   By: Bretta Bang M.D.   On: 03/07/2013 17:06    Scheduled Meds: . ceFEPime (MAXIPIME) IV  2 g Intravenous Q12H  . docusate sodium  100 mg Oral BID  . enoxaparin (LOVENOX) injection  55 mg Subcutaneous Q24H  . hydrochlorothiazide  25 mg Oral Daily  . ketoconazole   Topical BID  . lisinopril  20 mg Oral Daily  . nicotine  14 mg Transdermal Daily  . pantoprazole  40 mg Oral Daily  . vancomycin  1,500 mg Intravenous Q12H   Continuous Infusions: . DOPamine        Time spent: 25 minutes    Marlin Canary  [facility] Pager [phone] If 7PM-7AM, please contact night-coverage at [email or web address], password [facility] 03/09/2013, 7:31 AM  LOS: 10 days

## 2013-03-10 LAB — BASIC METABOLIC PANEL
CO2: 27 mEq/L (ref 19–32)
Calcium: 8.4 mg/dL (ref 8.4–10.5)
Creatinine, Ser: 0.84 mg/dL (ref 0.50–1.35)
GFR calc Af Amer: >90 mL/min (ref >90)
GFR calc non Af Amer: >90 mL/min (ref >90)
Sodium: 142 mEq/L (ref 137–147)

## 2013-03-10 LAB — VANCOMYCIN, TROUGH: Vancomycin Tr: 14.2 ug/mL (ref 10.0–20.0)

## 2013-03-10 MED ORDER — LISINOPRIL 20 MG PO TABS: 20.0000 mg | ORAL_TABLET | Freq: Every day | ORAL | Status: DC

## 2013-03-10 MED ORDER — SODIUM CHLORIDE 0.9 % IJ SOLN: 10.0000 mL | INTRAMUSCULAR | Status: DC | PRN

## 2013-03-10 MED ORDER — PANTOPRAZOLE SODIUM 40 MG PO TBEC: 40.0000 mg | DELAYED_RELEASE_TABLET | Freq: Every day | ORAL | Status: AC

## 2013-03-10 MED ORDER — VANCOMYCIN HCL 10 G IV SOLR: 1500.0000 mg | Freq: Two times a day (BID) | INTRAVENOUS | Status: AC

## 2013-03-10 MED ORDER — HYDROCODONE-ACETAMINOPHEN 5-325 MG PO TABS: 2.0000 | ORAL_TABLET | ORAL | Status: DC | PRN

## 2013-03-10 MED ORDER — NICOTINE 14 MG/24HR TD PT24: 14.0000 mg | MEDICATED_PATCH | Freq: Every day | TRANSDERMAL | Status: AC

## 2013-03-10 MED ORDER — HYDROCHLOROTHIAZIDE 25 MG PO TABS: 25.0000 mg | ORAL_TABLET | Freq: Every day | ORAL | Status: DC

## 2013-03-10 MED ORDER — DEXTROSE 5 % IV SOLN: 2.0000 g | Freq: Two times a day (BID) | INTRAVENOUS | Status: DC

## 2013-03-10 MED ORDER — ALUM & MAG HYDROXIDE-SIMETH 200-200-20 MG/5ML PO SUSP: 15.0000 mL | ORAL | Status: DC | PRN

## 2013-03-10 MED ORDER — KETOCONAZOLE 2 % EX CREA: TOPICAL_CREAM | Freq: Two times a day (BID) | CUTANEOUS | Status: AC

## 2013-03-10 NOTE — Progress Notes (Signed)
[facility] PROGRESS NOTE  Iseah Plouff Bianca JWJ:[id number] DOB: Jun 29, 1965 DOA: 02/27/2013 PCP: No PCP Per Patient  Brief narrative  47 y.o. male with a past medical history of Hypertension and active smoker. He presented with a 2 month history of foot pain starting with infection of right 5th toe that progressed due to medication noncompliance due to financial issues. He was admitted for management of foot cellulitis with concern for gangrene.   Assessment/Plan:  Peripheral Vascular disease with gangrene of right foot  - Arterial dopplers suggest severe PAD. Aortogram done by vascular surgery which shows occluded right anterior tibial and popliteal artery. Patient underwent right common femoral artery to below-the-knee popliteal bypass with vein graft and ray amputation of right 5th toe.. Tolerated sx well.  -continue vancomycin and cefepime for now per vascular-2 more weeks IV -patient complaining of pain all the time. Instructed to participate with PT and mobilize. Will reduce frequency/dose of dilaudid: continue Vicodin. Will add prn Toradol.  - PICC LINE as will need 2 more weeks of IV abx per vascular  Fever on 12/28  CXR negative. Blood cx sent- NGTD. He does have cellulitis over right leg.   History of HTN (hypertension)  Resume home blood pressure medications. When necessary hydralazine.   ? Homelessness  SNF placement  Erectile Dysfucntion  Possibly due to peripheral vascular disease. Can be worked up as outpatient   Tobacco use  Counseled on smoking cessation  ordered nicotine patch   Code Status: Full Code  DVT Prophylaxis: sq Lovenox  Family Communication: Discussed with patient.   DIET: regular  Antibiotics: vanco and cefepime ( 12/21>>)   Disposition Plan:  SNF- will need IV abx for 2 weeks- social worker consulted and contacted   HPI/Subjective:  No new c/o Wants to be left alone to sleep     Objective: Filed Vitals:   03/10/13 0544   BP: 142/80  Pulse: 74  Temp: 99.2 F (37.3 C)  Resp: 20    Intake/Output Summary (Last 24 hours) at 03/10/13 1139 Last data filed at 03/10/13 0500  Gross per 24 hour  Intake   1030 ml  Output   1950 ml  Net   -920 ml   Filed Weights   02/28/13 0153 03/04/13 0104 03/05/13 1503  Weight: 113.39 kg (249 lb 15.7 oz) 110.6 kg (243 lb 13.3 oz) 119.6 kg (263 lb 10.7 oz)    Exam:  General: Middle aged obese male in no acute distress  HEENT: No pallor, muscle to mucosa  Chest: clear b/l, no wheezing CVS: Normal S1 and S2, no murmurs rub or gallop  Abdomen: Soft, nontender, nondistended, bowel sounds present  Extremities: Dressing over right foot.     Data Reviewed: Basic Metabolic Panel:  Recent Labs Lab 03/03/13 1740 03/05/13 0543 03/06/13 0135 03/07/13 0450 03/10/13 0530  NA  --  141 138  --  142  K  --  3.7 3.6  --  4.3  CL  --  103 99  --  102  CO2  --  28 30  --  27  GLUCOSE  --  103* 94  --  128*  BUN  --  8 7  --  10  CREATININE 0.88 0.81 0.82 0.78 0.84  CALCIUM  --  8.5 8.3*  --  8.4   Liver Function Tests: No results found for this basename: AST, ALT, ALKPHOS, BILITOT, PROT, ALBUMIN,  in the last 168 hours No results found for this basename: LIPASE, AMYLASE,  in the last 168 hours No results found for this basename: AMMONIA,  in the last 168 hours CBC:  Recent Labs Lab 03/03/13 1740 03/05/13 0543 03/06/13 0135  WBC 6.2 5.1 9.6  HGB 14.0 13.1 13.1  HCT 41.8 40.2 39.5  MCV 81.2 81.0 81.8  PLT 269 266 252   Cardiac Enzymes: No results found for this basename: CKTOTAL, CKMB, CKMBINDEX, TROPONINI,  in the last 168 hours BNP (last 3 results) No results found for this basename: PROBNP,  in the last 8760 hours CBG: No results found for this basename: GLUCAP,  in the last 168 hours  Recent Results (from the past 240 hour(s))  SURGICAL PCR SCREEN     Status: None   Collection Time    03/03/13  7:06 AM      Result Value Range Status   MRSA, PCR  NEGATIVE  NEGATIVE Final   Staphylococcus aureus NEGATIVE  NEGATIVE Final   Comment:            The Xpert SA Assay (FDA     approved for NASAL specimens     in patients over 29 years of age),     is one component of     a comprehensive surveillance     program.  Test performance has     been validated by [employer] for patients greater     than or equal to 46 year old.     It is not intended     to diagnose infection nor to     guide or monitor treatment.  CULTURE, BLOOD (ROUTINE X 2)     Status: None   Collection Time    03/07/13  7:17 PM      Result Value Range Status   Specimen Description BLOOD RIGHT ARM   Final   Special Requests BOTTLES DRAWN AEROBIC AND ANAEROBIC 10CC   Final   Culture  Setup Time     Final   Value: 03/08/2013 00:57     Performed at [facility]   Culture     Final   Value:        BLOOD CULTURE RECEIVED NO GROWTH TO DATE CULTURE WILL BE HELD FOR 5 DAYS BEFORE ISSUING A FINAL NEGATIVE REPORT     Performed at [facility]   Report Status PENDING   Incomplete  CULTURE, BLOOD (ROUTINE X 2)     Status: None   Collection Time    03/07/13  7:30 PM      Result Value Range Status   Specimen Description BLOOD RIGHT WRIST   Final   Special Requests BOTTLES DRAWN AEROBIC ONLY [facility]   Final   Culture  Setup Time     Final   Value: 03/08/2013 00:57     Performed at [facility]   Culture     Final   Value:        BLOOD CULTURE RECEIVED NO GROWTH TO DATE CULTURE WILL BE HELD FOR 5 DAYS BEFORE ISSUING A FINAL NEGATIVE REPORT     Performed at [facility]   Report Status PENDING   Incomplete     Studies: No results found.  Scheduled Meds: . ceFEPime (MAXIPIME) IV  2 g Intravenous Q12H  . docusate sodium  100 mg Oral BID  . enoxaparin (LOVENOX) injection  55 mg Subcutaneous Q24H  . hydrochlorothiazide  25 mg Oral Daily  . ketoconazole   Topical BID  . lisinopril  20 mg Oral Daily  . nicotine  14 mg Transdermal Daily  .  pantoprazole  40 mg Oral Daily  . vancomycin  1,500 mg Intravenous Q12H   Continuous Infusions:      Time spent: 25 minutes    Benjamine Mola Raivyn Kabler  [facility] Pager [phone] If 7PM-7AM, please contact night-coverage at [email or web address], password [facility] 03/10/2013, 11:39 AM  LOS: 11 days

## 2013-03-10 NOTE — Progress Notes (Signed)
Rehab admission - Evaluated for possible admission.  I spoke with patient.  He is homeless and does not have somewhere to go after a short rehab stay.  I feel SNF placement will give him a longer period of time to completely recover.  I talked with the case manager and social worker and am recommending SNF since patient has no discharge plan.  Patient is in agreement.  Call me for questions.  #[phone]

## 2013-03-10 NOTE — Discharge Summary (Signed)
Physician Discharge Summary  Wayne Davis ZOX:[id number] DOB: 1966/02/02 DOA: 02/27/2013  PCP: No PCP Per Patient  Admit date: 02/27/2013 Discharge date: 03/10/2013  Time spent: 35 minutes  Recommendations for Outpatient Follow-up:  1. IV abx until 03/24/13 2. Cbc, BMP 1 week  Discharge Diagnoses:  Active Problems:   Cellulitis and abscess of foot   HTN (hypertension)   Cellulitis   PAD (peripheral artery disease)   Tobacco abuse   Discharge Condition: improved  Diet recommendation: cardiac  Filed Weights   02/28/13 0153 03/04/13 0104 03/05/13 1503  Weight: 113.39 kg (249 lb 15.7 oz) 110.6 kg (243 lb 13.3 oz) 119.6 kg (263 lb 10.7 oz)    History of present illness:  Wayne Davis is a 47 y.o. male  has a past medical history of Hypertension.  Presented with  2 month history of foot pain starting with infection of right 5th toe that progressed due to medication noncompliance due to financial issues. Denies fever but have had some chills. Otherwise unremarkable. 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 Aortogram done by vascular surgery which shows occluded right anterior tibial and popliteal artery. Patient underwent right common femoral artery to below-the-knee popliteal bypass with vein graft and ray amputation of right 5th toe.. Tolerated sx well.  -continue vancomycin and cefepime for now per vascular-2 more weeks IV  -patient complaining of pain all the time. Instructed to participate with PT and mobilize. Will reduce frequency/dose of dilaudid: continue Vicodin. Will add prn Toradol.  - PICC LINE as will need 2 more weeks of IV abx per vascular   Fever on 12/28  CXR negative. Blood cx sent- NGTD. He does have cellulitis over right leg.   History of HTN (hypertension)  Resume  home blood pressure medications. When necessary hydralazine.   ? Homelessness  SNF placement   Erectile Dysfucntion  Possibly due to peripheral vascular disease. Can be worked up as outpatient   Tobacco use  Counseled on smoking cessation  ordered nicotine patch    Procedures:    Consultations:  vascular  Discharge Exam: Filed Vitals:   03/10/13 0544  BP: 142/80  Pulse: 74  Temp: 99.2 F (37.3 C)  Resp: 20    General: A+Ox3, NAD Cardiovascular: rrr Respiratory: clear  Discharge Instructions      Discharge Orders   Future Appointments Provider Department Dept Phone   03/24/2013 11:30 AM Chuck Hint, MD [facility] [phone]   Future Orders Complete By Expires   Diet - low sodium heart healthy  As directed    Diet Carb Modified  As directed    Discharge instructions  As directed    Comments:     Continue IV abx until 03/24/13 Cbc, bmp 1 week   Increase activity slowly  As directed        Medication List    STOP taking these medications       BC HEADACHE POWDER PO     cephALEXin 500 MG capsule  Commonly known as:  KEFLEX     clindamycin 300 MG capsule  Commonly known as:  CLEOCIN     ibuprofen 200 MG tablet  Commonly known as:  ADVIL,MOTRIN     lisinopril-hydrochlorothiazide 20-25 MG per tablet  Commonly known as:  PRINZIDE,ZESTORETIC  TAKE these medications       acetaminophen 325 MG tablet  Commonly known as:  TYLENOL  Take 650 mg by mouth every 6 (six) hours as needed for moderate pain.     alum & mag hydroxide-simeth 200-200-20 MG/5ML suspension  Commonly known as:  MAALOX/MYLANTA  Take 15-30 mLs by mouth every 2 (two) hours as needed for indigestion.     dextrose 5 % SOLN 50 mL with ceFEPIme 2 G SOLR 2 g  Inject 2 g into the vein every 12 (twelve) hours.     hydrochlorothiazide 25 MG tablet  Commonly known as:  HYDRODIURIL  Take 1 tablet (25 mg total) by mouth daily.      HYDROcodone-acetaminophen 5-325 MG per tablet  Commonly known as:  NORCO/VICODIN  Take 2 tablets by mouth every 4 (four) hours as needed for moderate pain.     ketoconazole 2 % cream  Commonly known as:  NIZORAL  Apply topically 2 (two) times daily.     lisinopril 20 MG tablet  Commonly known as:  PRINIVIL,ZESTRIL  Take 1 tablet (20 mg total) by mouth daily.     nicotine 14 mg/24hr patch  Commonly known as:  NICODERM CQ - dosed in mg/24 hours  Place 1 patch (14 mg total) onto the skin daily.     pantoprazole 40 MG tablet  Commonly known as:  PROTONIX  Take 1 tablet (40 mg total) by mouth daily.     sodium chloride 0.9 % SOLN 500 mL with vancomycin 10 G SOLR 1,500 mg  Inject 1,500 mg into the vein every 12 (twelve) hours.       No Known Allergies Follow-up Information   Follow up with DICKSON,CHRISTOPHER S, MD In 2 weeks. (Office will call you to arange your appt (sent))    Specialty:  Vascular Surgery   Contact information:   [address] [place] [address] [phone]       Please follow up. (doctor in SNF)        The results of significant diagnostics from this hospitalization (including imaging, microbiology, ancillary and laboratory) are listed below for reference.    Significant Diagnostic Studies: Dg Chest 2 View  03/04/2013   CLINICAL DATA:  Preop  EXAM: CHEST  2 VIEW  COMPARISON:  None.  FINDINGS: Normal heart size. Clear lungs. Postoperative changes in the right suprahilar region. Status post sternotomy. No pneumothorax. Chronic pleural changes at the lung apices.  IMPRESSION: No active cardiopulmonary disease.   Electronically Signed   By: Maryclare Bean M.D.   On: 03/04/2013 09:50   Dg Ang/ext/uni/or Right  03/05/2013   CLINICAL DATA:  Right femoral to popliteal artery bypass for right 5th toe gangrene with infrainguinal peripheral vascular disease.  EXAM: RIGHT ANG/EXT/UNI/ OR  TECHNIQUE: Intraoperative image was obtained.  CONTRAST:  See operative  documentation  COMPARISON:  None  FLUOROSCOPY TIME:  See operative documentation  FINDINGS: Intraoperative image obtained in a lateral projection shows a saphenous vein bypass graft with anastomosis to the below knee popliteal artery. Distal anastomosis appears normally patent. Three-vessel patent runoff is demonstrated into the distal calf. No filling defects or extravasation identified.  IMPRESSION: Imaging during bypass graft surgery shows a patent distal graft, patent distal anastomosis to the popliteal artery below the knee and patent 3 vessel runoff.   Electronically Signed   By: Irish Lack M.D.   On: 03/05/2013 12:53   Dg Chest Port 1 View  03/07/2013   CLINICAL DATA:  Fever  EXAM: PORTABLE CHEST - 1 VIEW  COMPARISON:  March 04, 2013  FINDINGS: Lungs are clear. Heart size and pulmonary vascularity are normal. No adenopathy. Patient is status post median sternotomy. No bone lesions. No pneumothorax.  IMPRESSION: No edema or consolidation.   Electronically Signed   By: Bretta Bang M.D.   On: 03/07/2013 17:06   Dg Foot Complete Right  02/24/2013   CLINICAL DATA:  Evaluate for osteomyelitis.  Cellulitis.  EXAM: RIGHT FOOT COMPLETE - 3+ VIEW  COMPARISON:  None.  FINDINGS: Mid forefoot soft tissue swelling. No evidence of osteomyelitis or septic arthritis. Punctate density in the plantar soft tissues, the level of the mid tarsals persists, 1 to 2 mm in size. This is somewhat removed from the maximal side of soft tissue swelling, suggesting calcification or non acute foreign body.  IMPRESSION: 1. No evidence of osteomyelitis. 2. Tiny calcification or other density in the plantar midfoot. The distance from the soft tissue swelling suggests this is an incidental finding rather than acute foreign body.   Electronically Signed   By: Tiburcio Pea M.D.   On: 02/24/2013 01:21   Dg Toe 5th Right  02/18/2013   CLINICAL DATA:  Pain for 1-1/2 months, originating in the 5th digit. Dryness, swelling.  No known diabetes or injury.  EXAM: RIGHT FIFTH TOE  COMPARISON:  12/28/2012  FINDINGS: There is mild soft tissue swelling. No evidence for acute fracture or dislocation. No soft tissue gas identified. Along the plantar surface of the foot, a small density is identified, not in close proximity to the 5th digit. Clinical correlation is recommended.  IMPRESSION: 1. No evidence for acute abnormality of the 5th digit. 2. Mild soft tissue swelling. 3. Question of foreign body along the plantar aspect of foot as described above.   Electronically Signed   By: Rosalie Gums M.D.   On: 02/18/2013 23:21    Microbiology: Recent Results (from the past 240 hour(s))  SURGICAL PCR SCREEN     Status: None   Collection Time    03/03/13  7:06 AM      Result Value Range Status   MRSA, PCR NEGATIVE  NEGATIVE Final   Staphylococcus aureus NEGATIVE  NEGATIVE Final   Comment:            The Xpert SA Assay (FDA     approved for NASAL specimens     in patients over 32 years of age),     is one component of     a comprehensive surveillance     program.  Test performance has     been validated by [employer] for patients greater     than or equal to 49 year old.     It is not intended     to diagnose infection nor to     guide or monitor treatment.  CULTURE, BLOOD (ROUTINE X 2)     Status: None   Collection Time    03/07/13  7:17 PM      Result Value Range Status   Specimen Description BLOOD RIGHT ARM   Final   Special Requests BOTTLES DRAWN AEROBIC AND ANAEROBIC 10CC   Final   Culture  Setup Time     Final   Value: 03/08/2013 00:57     Performed at [facility]   Culture     Final   Value:        BLOOD CULTURE RECEIVED NO GROWTH TO DATE CULTURE  WILL BE HELD FOR 5 DAYS BEFORE ISSUING A FINAL NEGATIVE REPORT     Performed at [facility]   Report Status PENDING   Incomplete  CULTURE, BLOOD (ROUTINE X 2)     Status: None   Collection Time    03/07/13  7:30 PM      Result Value Range  Status   Specimen Description BLOOD RIGHT WRIST   Final   Special Requests BOTTLES DRAWN AEROBIC ONLY 7CC   Final   Culture  Setup Time     Final   Value: 03/08/2013 00:57     Performed at [facility]   Culture     Final   Value:        BLOOD CULTURE RECEIVED NO GROWTH TO DATE CULTURE WILL BE HELD FOR 5 DAYS BEFORE ISSUING A FINAL NEGATIVE REPORT     Performed at [facility]   Report Status PENDING   Incomplete     Labs: Basic Metabolic Panel:  Recent Labs Lab 03/03/13 1740 03/05/13 0543 03/06/13 0135 03/07/13 0450 03/10/13 0530  NA  --  141 138  --  142  K  --  3.7 3.6  --  4.3  CL  --  103 99  --  102  CO2  --  28 30  --  27  GLUCOSE  --  103* 94  --  128*  BUN  --  8 7  --  10  CREATININE 0.88 0.81 0.82 0.78 0.84  CALCIUM  --  8.5 8.3*  --  8.4   Liver Function Tests: No results found for this basename: AST, ALT, ALKPHOS, BILITOT, PROT, ALBUMIN,  in the last 168 hours No results found for this basename: LIPASE, AMYLASE,  in the last 168 hours No results found for this basename: AMMONIA,  in the last 168 hours CBC:  Recent Labs Lab 03/03/13 1740 03/05/13 0543 03/06/13 0135  WBC 6.2 5.1 9.6  HGB 14.0 13.1 13.1  HCT 41.8 40.2 39.5  MCV 81.2 81.0 81.8  PLT 269 266 252   Cardiac Enzymes: No results found for this basename: CKTOTAL, CKMB, CKMBINDEX, TROPONINI,  in the last 168 hours BNP: BNP (last 3 results) No results found for this basename: PROBNP,  in the last 8760 hours CBG: No results found for this basename: GLUCAP,  in the last 168 hours     Signed:  Marlin Canary  [facility] 03/10/2013, 12:44 PM

## 2013-03-10 NOTE — Progress Notes (Signed)
ANTIBIOTIC CONSULT NOTE - FOLLOW UP  Pharmacy Consult for Vancomycin + Cefepime Indication: RLE cellulitis  No Known Allergies  Patient Measurements: Height: 6\' 1"  (185.4 cm) Weight: 263 lb 10.7 oz (119.6 kg) IBW/kg (Calculated) : 79.9  Vital Signs: Temp: 99.2 F (37.3 C) (12/31 0544) Temp src: Oral (12/31 0544) BP: 142/80 mmHg (12/31 0544) Pulse Rate: 74 (12/31 0544) Intake/Output from previous day: 12/30 0701 - 12/31 0700 In: 1270 [P.O.:720; IV Piggyback:550] Out: 2200 [Urine:2200] Intake/Output from this shift:    Labs:  Recent Labs  03/10/13 0530  CREATININE 0.84   Estimated Creatinine Clearance: 144.1 ml/min (by C-G formula based on Cr of 0.84).  Recent Labs  03/10/13 1038  VANCOTROUGH 14.2     Microbiology: Recent Results (from the past 720 hour(s))  SURGICAL PCR SCREEN     Status: None   Collection Time    03/03/13  7:06 AM      Result Value Range Status   MRSA, PCR NEGATIVE  NEGATIVE Final   Staphylococcus aureus NEGATIVE  NEGATIVE Final   Comment:            The Xpert SA Assay (FDA     approved for NASAL specimens     in patients over 54 years of age),     is one component of     a comprehensive surveillance     program.  Test performance has     been validated by [employer] for patients greater     than or equal to 75 year old.     It is not intended     to diagnose infection nor to     guide or monitor treatment.  CULTURE, BLOOD (ROUTINE X 2)     Status: None   Collection Time    03/07/13  7:17 PM      Result Value Range Status   Specimen Description BLOOD RIGHT ARM   Final   Special Requests BOTTLES DRAWN AEROBIC AND ANAEROBIC 10CC   Final   Culture  Setup Time     Final   Value: 03/08/2013 00:57     Performed at [facility]   Culture     Final   Value:        BLOOD CULTURE RECEIVED NO GROWTH TO DATE CULTURE WILL BE HELD FOR 5 DAYS BEFORE ISSUING A FINAL NEGATIVE REPORT     Performed at [facility]   Report  Status PENDING   Incomplete  CULTURE, BLOOD (ROUTINE X 2)     Status: None   Collection Time    03/07/13  7:30 PM      Result Value Range Status   Specimen Description BLOOD RIGHT WRIST   Final   Special Requests BOTTLES DRAWN AEROBIC ONLY [facility]   Final   Culture  Setup Time     Final   Value: 03/08/2013 00:57     Performed at [facility]   Culture     Final   Value:        BLOOD CULTURE RECEIVED NO GROWTH TO DATE CULTURE WILL BE HELD FOR 5 DAYS BEFORE ISSUING A FINAL NEGATIVE REPORT     Performed at [facility]   Report Status PENDING   Incomplete    Anti-infectives   Start     Dose/Rate Route Frequency Ordered Stop   03/10/13 0000  dextrose 5 % SOLN 50 mL with ceFEPIme 2 G SOLR 2 g  2 g 100 mL/hr over 30 Minutes Intravenous Every 12 hours 03/10/13 1240     03/10/13 0000  sodium chloride 0.9 % SOLN 500 mL with vancomycin 10 G SOLR 1,500 mg     1,500 mg 250 mL/hr over 120 Minutes Intravenous Every 12 hours 03/10/13 1240     03/06/13 0000  vancomycin (VANCOCIN) 1,500 mg in sodium chloride 0.9 % 500 mL IVPB     1,500 mg 250 mL/hr over 120 Minutes Intravenous Every 12 hours 03/05/13 2116     03/05/13 2200  ceFEPIme (MAXIPIME) 2 g in dextrose 5 % 50 mL IVPB     2 g 100 mL/hr over 30 Minutes Intravenous Every 12 hours 03/05/13 2117     03/03/13 0000  [MAR Hold]  vancomycin (VANCOCIN) 1,500 mg in sodium chloride 0.9 % 500 mL IVPB  Status:  Discontinued     (On MAR Hold since 03/05/13 0641)   1,500 mg 250 mL/hr over 120 Minutes Intravenous Every 12 hours 03/02/13 1654 03/05/13 2124   03/02/13 2000  [MAR Hold]  ceFEPIme (MAXIPIME) 2 g in dextrose 5 % 50 mL IVPB  Status:  Discontinued     (On MAR Hold since 03/05/13 0641)   2 g 100 mL/hr over 30 Minutes Intravenous Every 12 hours 03/02/13 1710 03/05/13 2124   02/28/13 1500  vancomycin (VANCOCIN) IVPB 1000 mg/200 mL premix  Status:  Discontinued     1,000 mg 200 mL/hr over 60 Minutes Intravenous Every 12 hours 02/28/13  0252 03/02/13 1654   02/28/13 1000  levofloxacin (LEVAQUIN) IVPB 500 mg  Status:  Discontinued     500 mg 100 mL/hr over 60 Minutes Intravenous Every 24 hours 02/28/13 0914 03/02/13 2009   02/28/13 0300  vancomycin (VANCOCIN) 2,000 mg in sodium chloride 0.9 % 500 mL IVPB     2,000 mg 250 mL/hr over 120 Minutes Intravenous  Once 02/28/13 0252 02/28/13 0536   02/27/13 2200  clindamycin (CLEOCIN) IVPB 600 mg     600 mg 100 mL/hr over 30 Minutes Intravenous  Once 02/27/13 2144 02/27/13 2318      Assessment: 47 y.o. M who continues on Vancomycin + Cefepime for empiric coverage of RLE cellulitis. The patient is s/p R fem-pop BPG, arteriogram, and R 5th toe amputation on 12/26. Noted fever spike on 12/28, now back down to wnl. Renal function remains stable -- a Vancomycin trough this morning remained therapeutic (measured level 14.2, true trough~12.5 mcg/ml, goal of 10-15 mcg/ml). Clarified LOT with [facility] yesterday -- plan is for 2 more weeks. Doses remain appropriate for now.   Goal of Therapy:  Vancomycin trough level 10-15 mcg/ml Proper antibiotics for infection/cultures adjusted for renal/hepatic function   Plan:  1. Continue Vancomycin 1500 mg IV every 12 hours 2. Continue Cefepime 2g IV every 12 hours 3. Will continue to follow renal function, culture results, LOT, and antibiotic de-escalation plans   Georgina Pillion, PharmD, BCPS Clinical Pharmacist Pager: [phone] 03/10/2013 12:51 PM

## 2013-03-10 NOTE — Progress Notes (Signed)
Peripherally Inserted Central Catheter/Midline Placement  The IV Nurse has discussed with the patient and/or persons authorized to consent for the patient, the purpose of this procedure and the potential benefits and risks involved with this procedure.  The benefits include less needle sticks, lab draws from the catheter and patient may be discharged home with the catheter.  Risks include, but not limited to, infection, bleeding, blood clot (thrombus formation), and puncture of an artery; nerve damage and irregular heat beat.  Alternatives to this procedure were also discussed.  PICC/Midline Placement Documentation        Lisabeth Devoid 03/10/2013, 1:54 PM Consent obtained by Stacie Glaze, RN

## 2013-03-10 NOTE — Progress Notes (Signed)
Clinical Social Work Department CLINICAL SOCIAL WORK PLACEMENT NOTE 03/10/2013  Patient:  Wayne Davis, Wayne Davis  Account Number:  [id number] Admit date:  02/27/2013  Clinical Social Worker:  Carren Rang  Date/time:  03/09/2013 03:15 PM  Clinical Social Work is seeking post-discharge placement for this patient at the following level of care:   SKILLED NURSING   (*CSW will update this form in Epic as items are completed)   03/09/2013  Patient/family provided with [facility] Department of Clinical Social Work's list of facilities offering this level of care within the geographic area requested by the patient (or if unable, by the patient's family).  03/09/2013  Patient/family informed of their freedom to choose among providers that offer the needed level of care, that participate in Medicare, Medicaid or managed care program needed by the patient, have an available bed and are willing to accept the patient.  03/09/2013  Patient/family informed of [facility]' ownership interest in [facility], as well as of the fact that they are under no obligation to receive care at this facility.  PASARR submitted to EDS on  PASARR number received from EDS on   FL2 transmitted to all facilities in geographic area requested by pt/family on  03/09/2013 FL2 transmitted to all facilities within larger geographic area on   Patient informed that his/her managed care company has contracts with or will negotiate with  certain facilities, including the following:     Patient/family informed of bed offers received:  03/09/2013 Patient chooses bed at [facility] Physician recommends and patient chooses bed at    Patient to be transferred to [facility] on  03/10/2013 Patient to be transferred to facility by EMS  The following physician request were entered in Epic:   Additional Comments:  Maree Krabbe, MSW, [employer] [phone]

## 2013-03-10 NOTE — Progress Notes (Signed)
Occupational Therapy Treatment Patient Details Name: Wayne Davis MRN: [id number] DOB: 22-Mar-1965 Today's Date: 03/10/2013 Time: 4401-0272 OT Time Calculation (min): 30 min  OT Assessment / Plan / Recommendation  History of present illness Pt admit for right femoral to below knee popliteal artery bypass with vein graft and ray amputation of right fifth toe   OT comments  Pt still with significant pain in his RLE and groin areas rated at 9/10 during session.  Pt was able to perform bathing and dressing sit to stand from the EOB today with overall min assist but would not agree to OOB at this time secondary to increased pain.  Agree with plan for SNF rehab as pt is not currently at a level to be by himself yet.  Will continue to follow in acute care.  Follow Up Recommendations  SNF;Supervision/Assistance - 24 hour       Equipment Recommendations  None recommended by OT       Frequency Min 2X/week   Progress towards OT Goals Progress towards OT goals: Progressing toward goals  Plan Discharge plan needs to be updated    Precautions / Restrictions Precautions Precautions: Fall Restrictions Weight Bearing Restrictions: Yes RLE Weight Bearing: Partial weight bearing RLE Partial Weight Bearing Percentage or Pounds: heel only   Pertinent Vitals/Pain Pt reports pain at 9/10 in the RLE and groin area.  Nursing made aware and pt repositioned in bed.    ADL  Grooming: Performed;Wash/dry hands;Wash/dry face;Supervision/safety Where Assessed - Grooming: Unsupported sitting Upper Body Bathing: Set up;Performed Lower Body Bathing: Performed;Minimal assistance Where Assessed - Lower Body Bathing: Supported sit to stand Lower Body Dressing: Performed;Minimal assistance Where Assessed - Lower Body Dressing: Supported sit to stand Transfers/Ambulation Related to ADLs: Pt transferred to the EOB with supervision and then stood during bathing with min assist.  Pt declined transferring  to bedside chair secondary to increased pain.   ADL Comments: Pt able to use one handed method for donning and doffing the left sock this session.  Pt still with no Darco shoe noted in the  room for weightbearing.        OT Goals(current goals can now be found in the care plan section) Acute Rehab OT Goals Patient Stated Goal: Pt wants his pain to get better.  Visit Information  Last OT Received On: 03/10/13 Assistance Needed: +1 History of Present Illness: Pt admit for right femoral to below knee popliteal artery bypass with vein graft and ray amputation of right fifth toe          Cognition  Cognition Arousal/Alertness: Awake/alert Behavior During Therapy: Restless Overall Cognitive Status: Within Functional Limits for tasks assessed    Mobility  Bed Mobility Bed Mobility: Sit to Supine Supine to Sit: 5: Supervision;With rails;HOB elevated Sit to Supine: 5: Supervision;HOB flat Transfers Transfers: Sit to Stand Sit to Stand: 4: Min assist;With upper extremity assist;From bed Stand to Sit: 4: Min assist;With upper extremity assist;To bed       Balance Balance Balance Assessed: Yes Dynamic Standing Balance Dynamic Standing - Balance Support: Left upper extremity supported;During functional activity Dynamic Standing - Level of Assistance: 4: Min assist   End of Session OT - End of Session Equipment Utilized During Treatment: Rolling walker Activity Tolerance: Patient limited by pain Patient left: with call bell/phone within reach;in bed Nurse Communication: Mobility status;Patient requests pain meds      Kipling Graser OTR/L 03/10/2013, 10:33 AM

## 2013-03-10 NOTE — Progress Notes (Signed)
Patient has a confirmed bed at [facility] in [place], [place]. CSW spoke with patient and patient is agreeable to go there for SNF.  Maree Krabbe, MSW, Theresia Majors [phone]

## 2013-03-10 NOTE — Progress Notes (Addendum)
[facility] Progress Note  03/10/2013 8:30 AM 5 Days Post-Op  Subjective:  Not much to say this morning.  When asked, he states he is still taking IV pain medication.  Tm 99.2 VSS  Filed Vitals:   03/10/13 0544  BP: 142/80  Pulse: 74  Temp: 99.2 F (37.3 C)  Resp: 20    Physical Exam: Incisions:  All are healing nicely; there is some moisture in the right groin at incision.  Extremities:  Right foot is bandaged and is clean and dry.    CBC    Component Value Date/Time   WBC 9.6 03/06/2013 0135   RBC 4.83 03/06/2013 0135   HGB 13.1 03/06/2013 0135   HCT 39.5 03/06/2013 0135   PLT 252 03/06/2013 0135   MCV 81.8 03/06/2013 0135   MCH 27.1 03/06/2013 0135   MCHC 33.2 03/06/2013 0135   RDW 16.1* 03/06/2013 0135   LYMPHSABS 2.9 02/27/2013 2225   MONOABS 0.6 02/27/2013 2225   EOSABS 0.3 02/27/2013 2225   BASOSABS 0.0 02/27/2013 2225    BMET    Component Value Date/Time   NA 142 03/10/2013 0530   K 4.3 03/10/2013 0530   CL 102 03/10/2013 0530   CO2 27 03/10/2013 0530   GLUCOSE 128* 03/10/2013 0530   BUN 10 03/10/2013 0530   CREATININE 0.84 03/10/2013 0530   CALCIUM 8.4 03/10/2013 0530   GFRNONAA >90 03/10/2013 0530   GFRAA >90 03/10/2013 0530    INR No results found for this basename: inr     Intake/Output Summary (Last 24 hours) at 03/10/13 0830 Last data filed at 03/10/13 0500  Gross per 24 hour  Intake   1270 ml  Output   2200 ml  Net   -930 ml     Assessment:  47 y.o. male is s/p:  1. Right femoral to below knee popliteal artery bypass with nonreversible translocated saphenous vein graft  2. Intraoperative arteriogram  3. 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 -DVT prophylaxis:  Lovenox -spoke with Dr. Myra Gianotti yesterday and he suggests IV ABx for 2 more weeks. -FL2's have been distributed to SNF for disposition -f/u with Dr.  Edilia Bo in a couple of weeks. -keep dry gauze to right groin to help wick moisture to decrease chance of infection.  I have discussed this with the pt 2 days in a row.   [name], PA-C [facility] [phone] 03/10/2013 8:30 AM  Agree with above. Incisions look fine. Dressing on foot was just changed. Transfer did not happen today. Hopefully to SNF soon.  Waverly Ferrari, MD, FACS Beeper [phone] 03/10/2013

## 2013-03-10 NOTE — Progress Notes (Signed)
Pt discharging to SNF [facility] in [place] via ambulance transport.  Pt has new single lumen PICC ready for use.  Dressing change completed per order, and PO pains meds given.  Nurse to nurse report complete.  Await transport.

## 2013-03-10 NOTE — Progress Notes (Signed)
Clinical Social Worker facilitated patient discharge by contacting the patient and facility, [facility].  Patient agreeable to this plan and arranging transport via EMS.. CSW will sign off, as social work intervention is no longer needed.  Maree Krabbe, MSW, Theresia Majors [phone]

## 2013-03-12 NOTE — Progress Notes (Signed)
Agree with the above  WElls Brabham,

## 2013-03-14 LAB — CULTURE, BLOOD (ROUTINE X 2)
Culture: NO GROWTH
Culture: NO GROWTH

## 2013-03-23 ENCOUNTER — Encounter: Payer: Self-pay | Admitting: Vascular Surgery

## 2013-03-24 ENCOUNTER — Ambulatory Visit (INDEPENDENT_AMBULATORY_CARE_PROVIDER_SITE_OTHER): Payer: Self-pay | Admitting: Vascular Surgery

## 2013-03-24 ENCOUNTER — Encounter: Payer: Self-pay | Admitting: Vascular Surgery

## 2013-03-24 VITALS — BP 124/73 | HR 71 | Ht 73.0 in | Wt 263.0 lb

## 2013-03-24 DIAGNOSIS — I70269 Atherosclerosis of native arteries of extremities with gangrene, unspecified extremity: Secondary | ICD-10-CM

## 2013-03-24 DIAGNOSIS — Z48812 Encounter for surgical aftercare following surgery on the circulatory system: Secondary | ICD-10-CM

## 2013-03-24 NOTE — Addendum Note (Signed)
Addended by: Sharee PimpleMCCHESNEY, MARILYN K on: 03/24/2013 04:19 PM   Modules accepted: Orders

## 2013-03-24 NOTE — Assessment & Plan Note (Signed)
The patient is doing well status post a right femoral to below knee popliteal artery bypass with a vein graft and amputation of the right fifth toe. The toe amputation site will heal fine. I see him back in 3 months with ABIs in the duplex of his graft. He knows to call sooner if he has problems.

## 2013-03-24 NOTE — Progress Notes (Signed)
   Patient name: Wayne Davis MRN: [id number] DOB: 01-09-1966 Sex: male  REASON FOR VISIT: Follow up after right femoropopliteal bypass.  HPI: Wayne Davis is a 48 y.o. male who presented with a gangrenous wound of his right fifth toe. He has undergone previous iliac angioplasty and angioplasty and stenting of his entire superficial femoral artery in [place]. This was all occluded. He underwent an arteriogram which showed reconstitution of the below knee popliteal artery. It was felt that his only chance for revascularization was femoral to below knee pop 2 artery bypass grafting.  On 02/27/2013, he underwent right femoral to below knee popliteal artery bypass with a vein graft antegrade amputation of the right fifth toe. He was discharged on 03/10/2013. He comes in for a routine follow up visit.  Overall is doing well and the nursing facility has been doing dressing changes. His activity has been fairly limited.  REVIEW OF SYSTEMS: Arly.Keller[X ] denotes positive finding; [  ] denotes negative finding  CARDIOVASCULAR:  [ ]  chest pain   [ ]  dyspnea on exertion    CONSTITUTIONAL:  [ ]  fever   [ ]  chills  PHYSICAL EXAM: Filed Vitals:   03/24/13 1147  BP: 124/73  Pulse: 71  Height: 6\' 1"  (1.854 m)  Weight: 263 lb (119.296 kg)  SpO2: 99%   Body mass index is 34.71 kg/(m^2). GENERAL: The patient is a well-nourished male, in no acute distress. The vital signs are documented above. CARDIOVASCULAR: There is a regular rate and rhythm. PULMONARY: There is good air exchange bilaterally without wheezing or rales. He has a palpable right popliteal pulse and right dorsalis pedis pulse.His right fifth 20 dictation site is healing nicely. There is a small open area measuring approximately centimeter in diameter which is well perfused. His incisions are healing nicely.  MEDICAL ISSUES:  Atherosclerosis of native arteries of the extremities with gangrene The patient is doing well status  post a right femoral to below knee popliteal artery bypass with a vein graft and amputation of the right fifth toe. The toe amputation site will heal fine. I see him back in 3 months with ABIs in the duplex of his graft. He knows to call sooner if he has problems.   DICKSON,CHRISTOPHER S [facility] [place] Beeper: [phone]

## 2013-04-08 ENCOUNTER — Telehealth: Payer: Self-pay | Admitting: *Deleted

## 2013-04-08 NOTE — Telephone Encounter (Signed)
Patient called wanting a referral to a urologist in [facility]. He says that he sees a primary doctor at [facility] and was a previous [facility] patient. I told him to call them for a referral and that they may can help him with his medications / samples . He states that he was homeless but is now living with a friend in [place]. He wants to be able to see a doctor there.

## 2013-04-30 ENCOUNTER — Telehealth: Payer: Self-pay | Admitting: *Deleted

## 2013-04-30 NOTE — Telephone Encounter (Signed)
Returning Mr. Wayne Davis's call requesting pain medication.  Mr. Wayne Davis stated that "after having sex with my girlfriend last night" he experienced pain from right groin down his right leg.  States the pain is intermittent.  Denies fever/chills, drainage,redness, swelling.  Describes the pain as "achy."  Explained to Mr. Wayne Davis that since he is 2 months post op he will need to come in to see Dr. Edilia Boickson to be evaluated before pain medication can be prescribed.  Offered to schedule appointment with Dr. Edilia Boickson for evaluation.  Explained to Mr. Wayne Davis that Dr. Edilia Boickson is only in office on Wednesdays to see patients.  Offered several times to make appt. for him with Dr. Edilia Boickson.  Patient refused to schedule an appointment. Mr. Wayne Davis states he will call back if needed.

## 2013-05-11 ENCOUNTER — Ambulatory Visit: Payer: Medicaid Other | Admitting: Internal Medicine

## 2013-05-12 ENCOUNTER — Telehealth: Payer: Self-pay | Admitting: Vascular Surgery

## 2013-05-12 NOTE — Telephone Encounter (Signed)
Pt is going to court on 06/01/13 contesting his eviction from a rehab center and called to see if we would release his medical records to him or to his lawyer. I told him we would and how he could get them released to him.

## 2013-05-17 ENCOUNTER — Ambulatory Visit: Payer: Medicaid Other | Attending: Internal Medicine | Admitting: Internal Medicine

## 2013-05-17 ENCOUNTER — Encounter: Payer: Self-pay | Admitting: Internal Medicine

## 2013-05-17 VITALS — BP 194/135 | HR 91 | Temp 98.6°F | Resp 14 | Ht 73.0 in | Wt 264.2 lb

## 2013-05-17 DIAGNOSIS — N529 Male erectile dysfunction, unspecified: Secondary | ICD-10-CM | POA: Insufficient documentation

## 2013-05-17 DIAGNOSIS — I10 Essential (primary) hypertension: Secondary | ICD-10-CM

## 2013-05-17 DIAGNOSIS — I739 Peripheral vascular disease, unspecified: Secondary | ICD-10-CM | POA: Insufficient documentation

## 2013-05-17 LAB — COMPLETE METABOLIC PANEL WITH GFR
ALBUMIN: 4.1 g/dL (ref 3.5–5.2)
ALK PHOS: 63 U/L (ref 39–117)
ALT: 22 U/L (ref 0–53)
AST: 18 U/L (ref 0–37)
BILIRUBIN TOTAL: 0.4 mg/dL (ref 0.2–1.2)
BUN: 8 mg/dL (ref 6–23)
CO2: 30 mEq/L (ref 19–32)
Calcium: 9.1 mg/dL (ref 8.4–10.5)
Chloride: 104 mEq/L (ref 96–112)
Creat: 0.95 mg/dL (ref 0.50–1.35)
GFR, Est African American: >89 mL/min
GFR, Est Non African American: >89 mL/min
Glucose, Bld: 92 mg/dL (ref 70–99)
Potassium: 4 mEq/L (ref 3.5–5.3)
SODIUM: 139 meq/L (ref 135–145)
TOTAL PROTEIN: 7.1 g/dL (ref 6.0–8.3)

## 2013-05-17 LAB — CBC WITH DIFFERENTIAL/PLATELET
BASOS PCT: 1 % (ref 0–1)
Basophils Absolute: 0.1 10*3/uL (ref 0.0–0.1)
EOS ABS: 0.2 10*3/uL (ref 0.0–0.7)
Eosinophils Relative: 4 % (ref 0–5)
HCT: 44.4 % (ref 39.0–52.0)
Hemoglobin: 14.8 g/dL (ref 13.0–17.0)
Lymphocytes Relative: 41 % (ref 12–46)
Lymphs Abs: 2.3 10*3/uL (ref 0.7–4.0)
MCH: 25.8 pg — ABNORMAL LOW (ref 26.0–34.0)
MCHC: 33.3 g/dL (ref 30.0–36.0)
MCV: 77.5 fL — ABNORMAL LOW (ref 78.0–100.0)
Monocytes Absolute: 0.3 10*3/uL (ref 0.1–1.0)
Monocytes Relative: 6 % (ref 3–12)
NEUTROS ABS: 2.6 10*3/uL (ref 1.7–7.7)
NEUTROS PCT: 48 % (ref 43–77)
Platelets: 303 10*3/uL (ref 150–400)
RBC: 5.73 MIL/uL (ref 4.22–5.81)
RDW: 15.9 % — ABNORMAL HIGH (ref 11.5–15.5)
WBC: 5.5 10*3/uL (ref 4.0–10.5)

## 2013-05-17 LAB — LIPID PANEL
CHOL/HDL RATIO: 5.8 ratio
Cholesterol: 192 mg/dL (ref 0–200)
HDL: 33 mg/dL — AB (ref >39)
LDL CALC: 133 mg/dL — AB (ref 0–99)
TRIGLYCERIDES: 132 mg/dL (ref <150)
VLDL: 26 mg/dL (ref 0–40)

## 2013-05-17 MED ORDER — HYDROCHLOROTHIAZIDE 25 MG PO TABS: 25.0000 mg | ORAL_TABLET | Freq: Every day | ORAL | Status: AC

## 2013-05-17 MED ORDER — LISINOPRIL 20 MG PO TABS: 20.0000 mg | ORAL_TABLET | Freq: Every day | ORAL | Status: AC

## 2013-05-17 MED ORDER — TADALAFIL 20 MG PO TABS: 10.0000 mg | ORAL_TABLET | ORAL | Status: AC | PRN

## 2013-05-17 MED ORDER — CLONIDINE HCL 0.1 MG PO TABS
0.2000 mg | ORAL_TABLET | Freq: Once | ORAL | Status: AC
  Administered 2013-05-17: 0.2 mg via ORAL

## 2013-05-17 MED ORDER — ASPIRIN EC 81 MG PO TBEC: 81.0000 mg | DELAYED_RELEASE_TABLET | Freq: Every day | ORAL | Status: AC

## 2013-05-17 MED ORDER — NICOTINE 21 MG/24HR TD PT24: 21.0000 mg | MEDICATED_PATCH | Freq: Every day | TRANSDERMAL | Status: AC

## 2013-05-17 NOTE — Progress Notes (Signed)
Patient ID: Wayne Davis, male   DOB: 09-18-1965, 48 y.o.   MRN: [id number]   CC:  HPI: 48 year-old male status post a right femoral to below knee popliteal artery bypass with a vein graft and amputation of the right fifth toe. The toe amputation site will heal fine. He has an appointment with Dr. Edilia Boickson in April.  He has undergone previous iliac angioplasty and angioplasty and stenting of his entire superficial femoral artery in [place]. This was all occluded. He underwent an arteriogram which showed reconstitution of the below knee popliteal artery. It was felt that his only chance for revascularization was femoral to below knee pop 2 artery bypass grafting.  On 02/27/2013, he underwent right femoral to below knee popliteal artery bypass with a vein graft antegrade amputation of the right fifth toe. He was discharged on 03/10/2013. He was subsequently discharged to a nursing home from where he was kicked out. Patient has been home and is complaining of right lower extremity swelling. He did make a few calls to Dr. Adele Danickson's office for pain medication. Today his blood pressure is elevated in the 190s. He also complains of blurry vision Denies any chest pain any shortness of breath , Complaint of right lower extremity swelling since his surgery . Social history continues to smoke a pack and half a day Family history patient is unaware of any family members with any medical problems    No Known Allergies Past Medical History  Diagnosis Date  . Hypertension    Current Outpatient Prescriptions on File Prior to Visit  Medication Sig Dispense Refill  . acetaminophen (TYLENOL) 325 MG tablet Take 650 mg by mouth every 6 (six) hours as needed for moderate pain.       Marland Kitchen. ketoconazole (NIZORAL) 2 % cream Apply topically 2 (two) times daily.  15 g  0  . nicotine (NICODERM CQ - DOSED IN MG/24 HOURS) 14 mg/24hr patch Place 1 patch (14 mg total) onto the skin daily.  28 patch  0  .  pantoprazole (PROTONIX) 40 MG tablet Take 1 tablet (40 mg total) by mouth daily.      . sodium chloride 0.9 % SOLN 500 mL with vancomycin 10 G SOLR 1,500 mg Inject 1,500 mg into the vein every 12 (twelve) hours.       No current facility-administered medications on file prior to visit.   History reviewed. No pertinent family history. History   Social History  . Marital Status: Single    Spouse Name: N/A    Number of Children: N/A  . Years of Education: N/A   Occupational History  . Not on file.   Social History Main Topics  . Smoking status: Current Every Day Smoker -- 0.30 packs/day    Types: Cigarettes  . Smokeless tobacco: Never Used  . Alcohol Use: No  . Drug Use: Yes    Special: Marijuana  . Sexual Activity: Not on file   Other Topics Concern  . Not on file   Social History Narrative  . No narrative on file    Review of Systems  Constitutional: Negative for fever, chills, diaphoresis, activity change, appetite change and fatigue.  HENT: Negative for ear pain, nosebleeds, congestion, facial swelling, rhinorrhea, neck pain, neck stiffness and ear discharge.   Eyes: Negative for pain, discharge, redness, itching and visual disturbance.  Respiratory: Negative for cough, choking, chest tightness, shortness of breath, wheezing and stridor.   Cardiovascular: Negative for chest pain, palpitations and leg swelling.  Gastrointestinal: Negative for abdominal distention.  Genitourinary: Negative for dysuria, urgency, frequency, hematuria, flank pain, decreased urine volume, difficulty urinating and dyspareunia.  Musculoskeletal: Negative for back pain, joint swelling, arthralgias and gait problem.  Neurological: Negative for dizziness, tremors, seizures, syncope, facial asymmetry, speech difficulty, weakness, light-headedness, numbness and headaches.  Hematological: Negative for adenopathy. Does not bruise/bleed easily.  Psychiatric/Behavioral: Negative for hallucinations,  behavioral problems, confusion, dysphoric mood, decreased concentration and agitation.    Objective:   Filed Vitals:   05/17/13 1121  BP: 194/135  Pulse: 91  Temp: 98.6 F (37 C)  Resp: 14    Physical Exam  Constitutional: Appears well-developed and well-nourished. No distress.  HENT: Normocephalic. External right and left ear normal. Oropharynx is clear and moist.  Eyes: Conjunctivae and EOM are normal. PERRLA, no scleral icterus.  Neck: Normal ROM. Neck supple. No JVD. No tracheal deviation. No thyromegaly.  CVS: RRR, S1/S2 +, no murmurs, no gallops, no carotid bruit.  Pulmonary: Effort and breath sounds normal, no stridor, rhonchi, wheezes, rales.  Abdominal: Soft. BS +,  no distension, tenderness, rebound or guarding.  Musculoskeletal: Normal range of motion. No edema and no tenderness.  Lymphadenopathy: No lymphadenopathy noted, cervical, inguinal. Neuro: Alert. Normal reflexes, muscle tone coordination. No cranial nerve deficit. Skin: Skin is warm and dry. No rash noted. Not diaphoretic. No erythema. No pallor.  Psychiatric: Normal mood and affect. Behavior, judgment, thought content normal.   Lab Results  Component Value Date   WBC 9.6 03/06/2013   HGB 13.1 03/06/2013   HCT 39.5 03/06/2013   MCV 81.8 03/06/2013   PLT 252 03/06/2013   Lab Results  Component Value Date   CREATININE 0.84 03/10/2013   BUN 10 03/10/2013   NA 142 03/10/2013   K 4.3 03/10/2013   CL 102 03/10/2013   CO2 27 03/10/2013    Lab Results  Component Value Date   HGBA1C 7.1* 03/04/2013   Lipid Panel  No results found for this basename: chol, trig, hdl, cholhdl, vldl, ldlcalc       Assessment and plan:   Patient Active Problem List   Diagnosis Date Noted  . Atherosclerosis of native arteries of the extremities with gangrene 03/24/2013  . Tobacco abuse 03/07/2013  . PAD (peripheral artery disease) 03/02/2013  . Cellulitis and abscess of foot 02/28/2013  . HTN (hypertension)  02/28/2013  . Cellulitis 02/28/2013   Peripheral vascular disease Patient recommended to be on an aspirin He will follow up with Dr. Edilia Bo in April He will need a repeat ABI according to Dr. Edilia Bo    Hypertensive urgency Patient on lisinopril Will add HCTZ Blood pressure check in 3-4 weeks  opthomology referral for blurrry vision   Erectile dysfunction Start the patient on Cialis  Patient thinks he received flu and tetanus vaccination before surgery   Patient to follow up in 3 months        The patient was given clear instructions to go to ER or return to medical center if symptoms don't improve, worsen or new problems develop. The patient verbalized understanding. The patient was told to call to get any lab results if not heard anything in the next week.

## 2013-05-17 NOTE — Progress Notes (Signed)
Patient is here to establish care. Patient is not a diabetic, but suffers from hypertension. BP today of 104,135, pulse of 91. Patient stated that he took his hypertension medication today. Needs a refill of Lisinopril and pain medication for Rt knee pain. Complains of blurred vision and headaches x2 weeks. No OTC medication for pain. Pain scale of 8 today in Rt knee. Patient has slight swelling in the Rt knee; tries to elevate it daily. Requests nicotine patches and to be seen for erectile dysfunction. Also complains of bilateral eye pain x2 weeks.

## 2013-05-18 ENCOUNTER — Telehealth: Payer: Self-pay | Admitting: Emergency Medicine

## 2013-05-18 LAB — TSH: TSH: 0.985 u[IU]/mL (ref 0.350–4.500)

## 2013-05-18 LAB — TESTOSTERONE: TESTOSTERONE: 349 ng/dL (ref 300–890)

## 2013-05-18 NOTE — Telephone Encounter (Signed)
Pt given lab results 

## 2013-05-18 NOTE — Telephone Encounter (Signed)
Message copied by Darlis LoanSMITH, JILL D on Tue May 18, 2013  4:54 PM ------      Message from: Susie CassetteABROL MD, [facility]      Created: Tue May 18, 2013  2:43 PM       Notify patient and all labs are normal ------

## 2013-05-27 ENCOUNTER — Telehealth: Payer: Self-pay | Admitting: Emergency Medicine

## 2013-05-27 NOTE — Telephone Encounter (Signed)
Pt given testosterone level and normal labs Pt informed to f/u with urology appt

## 2013-06-23 ENCOUNTER — Other Ambulatory Visit (HOSPITAL_COMMUNITY): Payer: Medicaid Other

## 2013-06-23 ENCOUNTER — Ambulatory Visit: Payer: Medicaid Other | Admitting: Vascular Surgery

## 2013-06-23 ENCOUNTER — Encounter (HOSPITAL_COMMUNITY): Payer: Medicaid Other

## 2013-06-29 ENCOUNTER — Ambulatory Visit: Payer: Self-pay | Admitting: Urology

## 2013-06-29 ENCOUNTER — Encounter: Payer: Self-pay | Admitting: Vascular Surgery

## 2013-06-30 ENCOUNTER — Other Ambulatory Visit (HOSPITAL_COMMUNITY): Payer: Medicaid Other

## 2013-06-30 ENCOUNTER — Encounter (HOSPITAL_COMMUNITY): Payer: Medicaid Other

## 2013-06-30 ENCOUNTER — Ambulatory Visit: Payer: Medicaid Other | Admitting: Vascular Surgery

## 2013-10-20 ENCOUNTER — Encounter: Payer: Self-pay | Admitting: *Deleted

## 2014-02-17 ENCOUNTER — Encounter (HOSPITAL_COMMUNITY): Payer: Self-pay | Admitting: Vascular Surgery

## 2014-11-09 ENCOUNTER — Encounter: Payer: Self-pay | Admitting: Cardiology

## 2015-10-22 IMAGING — CR DG CHEST 1V PORT
1 series · 1 of 1 positions shown · non-contrast
Comparison: March 04, 2013

CLINICAL DATA: Fever

EXAM:
PORTABLE CHEST - 1 VIEW

[AP]
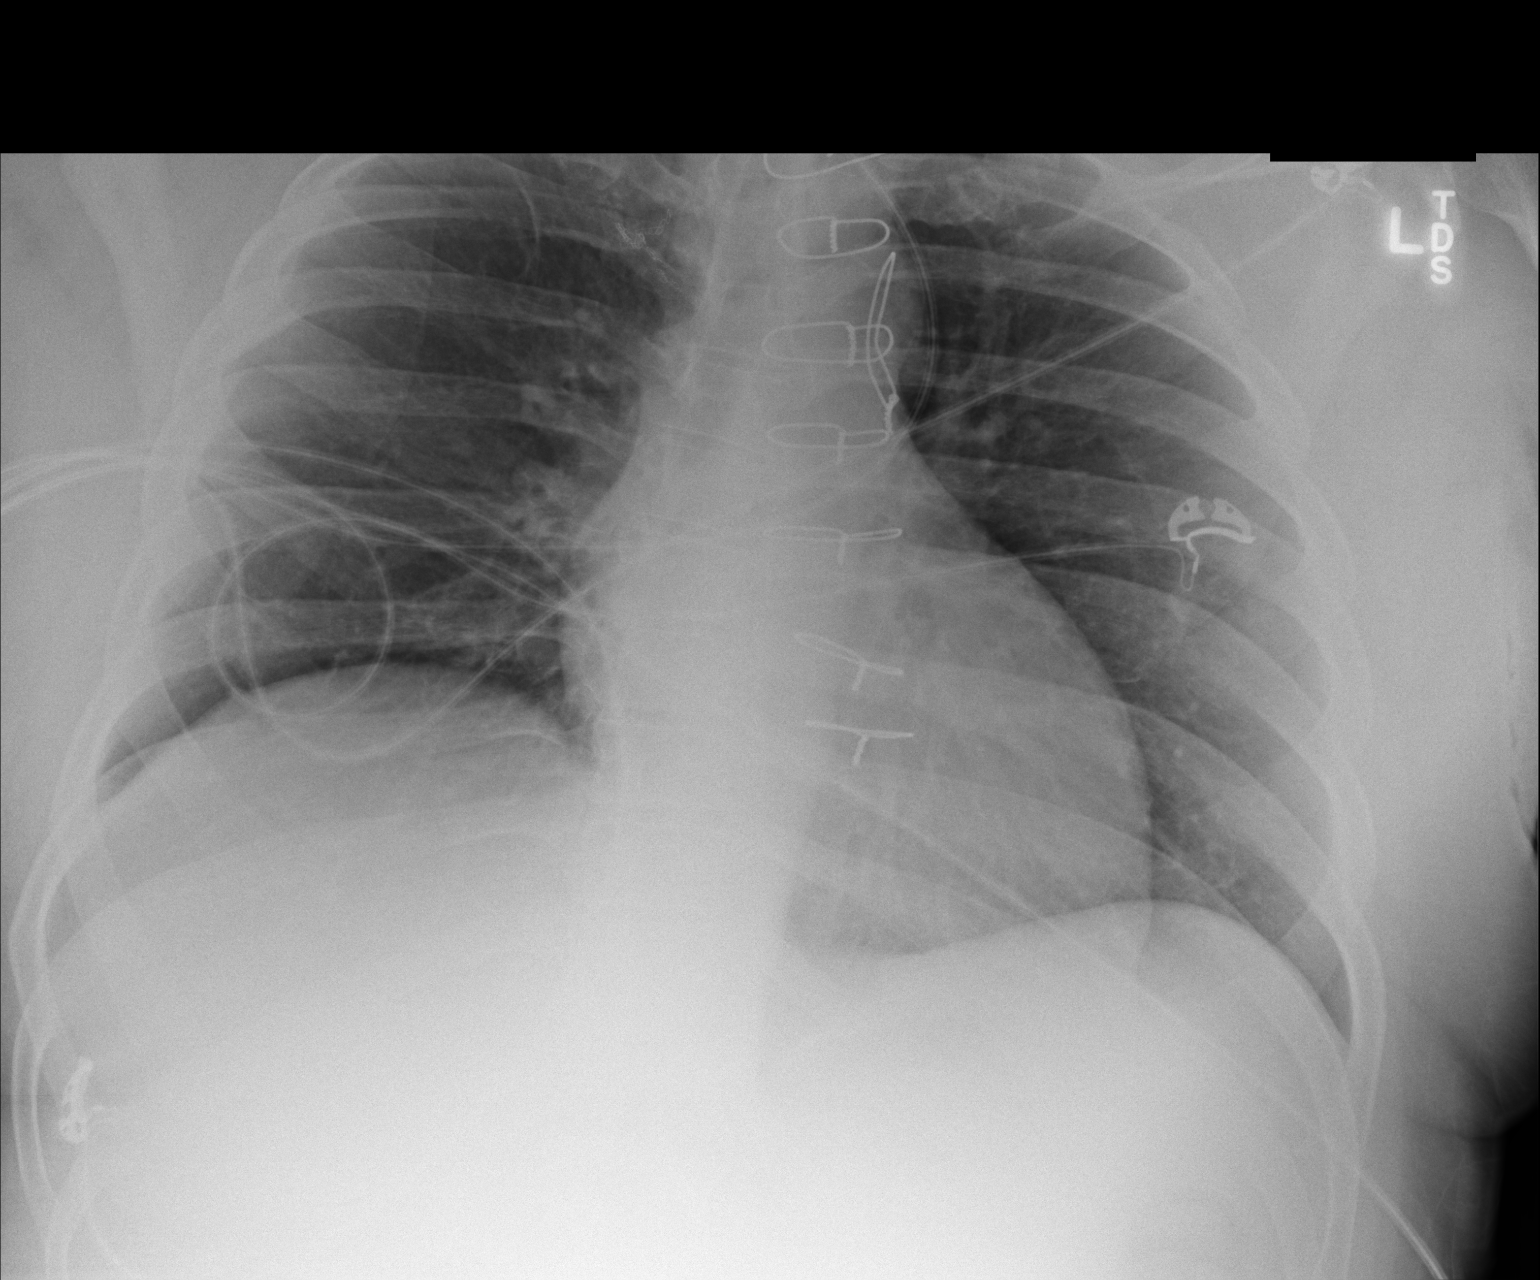

[1 of 1 positions shown; findings below may reference images not displayed]

FINDINGS: Lungs are clear. Heart size and pulmonary vascularity are normal. No
adenopathy. Patient is status post median sternotomy. No bone
lesions. No pneumothorax.
IMPRESSION: No edema or consolidation.
